# Patient Record
Sex: Male | Born: 1945 | Race: White | Hispanic: No | Marital: Married | Smoking: Current every day smoker
Health system: Southern US, Community
[De-identification: ages and names within clinical notes are randomized; demographics above are authoritative.]

## PROBLEM LIST (undated history)

## (undated) DIAGNOSIS — I10 Essential (primary) hypertension: Secondary | ICD-10-CM

## (undated) DIAGNOSIS — K219 Gastro-esophageal reflux disease without esophagitis: Secondary | ICD-10-CM

## (undated) DIAGNOSIS — E785 Hyperlipidemia, unspecified: Secondary | ICD-10-CM

## (undated) DIAGNOSIS — Z720 Tobacco use: Secondary | ICD-10-CM

## (undated) DIAGNOSIS — R7301 Impaired fasting glucose: Secondary | ICD-10-CM

## (undated) DIAGNOSIS — N423 Unspecified dysplasia of prostate: Secondary | ICD-10-CM

## (undated) DIAGNOSIS — R972 Elevated prostate specific antigen [PSA]: Secondary | ICD-10-CM

## (undated) HISTORY — DX: Impaired fasting glucose: R73.01

## (undated) HISTORY — DX: Tobacco use: Z72.0

## (undated) HISTORY — PX: GUM SURGERY: SHX658

## (undated) HISTORY — DX: Elevated prostate specific antigen (PSA): R97.20

---

## 1999-02-17 ENCOUNTER — Other Ambulatory Visit: Admission: RE | Admit: 1999-02-17 | Discharge: 1999-02-17 | Payer: Self-pay | Admitting: Urology

## 2000-01-27 ENCOUNTER — Ambulatory Visit (HOSPITAL_COMMUNITY): Admission: RE | Admit: 2000-01-27 | Discharge: 2000-01-27 | Payer: Self-pay | Admitting: Gastroenterology

## 2002-02-19 ENCOUNTER — Other Ambulatory Visit: Admission: RE | Admit: 2002-02-19 | Discharge: 2002-02-19 | Payer: Self-pay | Admitting: Otolaryngology

## 2002-03-05 ENCOUNTER — Encounter: Admission: RE | Admit: 2002-03-05 | Discharge: 2002-03-05 | Payer: Self-pay | Admitting: Otolaryngology

## 2002-03-05 ENCOUNTER — Encounter: Payer: Self-pay | Admitting: Otolaryngology

## 2002-04-04 ENCOUNTER — Ambulatory Visit (HOSPITAL_BASED_OUTPATIENT_CLINIC_OR_DEPARTMENT_OTHER): Admission: RE | Admit: 2002-04-04 | Discharge: 2002-04-04 | Payer: Self-pay | Admitting: Otolaryngology

## 2010-01-30 ENCOUNTER — Encounter: Payer: Self-pay | Admitting: Urology

## 2010-05-06 ENCOUNTER — Other Ambulatory Visit: Payer: Self-pay | Admitting: Gastroenterology

## 2011-03-06 DIAGNOSIS — F17200 Nicotine dependence, unspecified, uncomplicated: Secondary | ICD-10-CM | POA: Diagnosis not present

## 2011-03-06 DIAGNOSIS — I10 Essential (primary) hypertension: Secondary | ICD-10-CM | POA: Diagnosis not present

## 2011-03-06 DIAGNOSIS — E782 Mixed hyperlipidemia: Secondary | ICD-10-CM | POA: Diagnosis not present

## 2011-03-06 DIAGNOSIS — Z23 Encounter for immunization: Secondary | ICD-10-CM | POA: Diagnosis not present

## 2011-03-06 DIAGNOSIS — R42 Dizziness and giddiness: Secondary | ICD-10-CM | POA: Diagnosis not present

## 2011-03-06 DIAGNOSIS — G479 Sleep disorder, unspecified: Secondary | ICD-10-CM | POA: Diagnosis not present

## 2011-03-06 DIAGNOSIS — K219 Gastro-esophageal reflux disease without esophagitis: Secondary | ICD-10-CM | POA: Diagnosis not present

## 2011-03-06 DIAGNOSIS — E119 Type 2 diabetes mellitus without complications: Secondary | ICD-10-CM | POA: Diagnosis not present

## 2011-03-09 DIAGNOSIS — E782 Mixed hyperlipidemia: Secondary | ICD-10-CM | POA: Diagnosis not present

## 2011-03-09 DIAGNOSIS — E119 Type 2 diabetes mellitus without complications: Secondary | ICD-10-CM | POA: Diagnosis not present

## 2011-03-09 DIAGNOSIS — B353 Tinea pedis: Secondary | ICD-10-CM | POA: Diagnosis not present

## 2011-03-09 DIAGNOSIS — K219 Gastro-esophageal reflux disease without esophagitis: Secondary | ICD-10-CM | POA: Diagnosis not present

## 2011-03-09 DIAGNOSIS — R972 Elevated prostate specific antigen [PSA]: Secondary | ICD-10-CM | POA: Diagnosis not present

## 2011-03-09 DIAGNOSIS — F17200 Nicotine dependence, unspecified, uncomplicated: Secondary | ICD-10-CM | POA: Diagnosis not present

## 2011-03-09 DIAGNOSIS — G479 Sleep disorder, unspecified: Secondary | ICD-10-CM | POA: Diagnosis not present

## 2011-03-09 DIAGNOSIS — I10 Essential (primary) hypertension: Secondary | ICD-10-CM | POA: Diagnosis not present

## 2011-07-12 ENCOUNTER — Ambulatory Visit (HOSPITAL_COMMUNITY)
Admission: RE | Admit: 2011-07-12 | Discharge: 2011-07-12 | Disposition: A | Discharge status: Home / Self Care | Payer: Medicare Other | Source: Ambulatory Visit | Attending: Gastroenterology | Admitting: Gastroenterology

## 2011-07-12 ENCOUNTER — Encounter (HOSPITAL_COMMUNITY): Payer: Self-pay | Admitting: *Deleted

## 2011-07-12 ENCOUNTER — Encounter (HOSPITAL_COMMUNITY)
Admission: RE | Disposition: A | Discharge status: Home / Self Care | Payer: Self-pay | Source: Ambulatory Visit | Attending: Gastroenterology

## 2011-07-12 DIAGNOSIS — D126 Benign neoplasm of colon, unspecified: Secondary | ICD-10-CM | POA: Diagnosis not present

## 2011-07-12 DIAGNOSIS — K644 Residual hemorrhoidal skin tags: Secondary | ICD-10-CM | POA: Insufficient documentation

## 2011-07-12 DIAGNOSIS — K648 Other hemorrhoids: Secondary | ICD-10-CM | POA: Insufficient documentation

## 2011-07-12 HISTORY — DX: Unspecified dysplasia of prostate: N42.30

## 2011-07-12 HISTORY — DX: Essential (primary) hypertension: I10

## 2011-07-12 HISTORY — PX: COLONOSCOPY: SHX5424

## 2011-07-12 HISTORY — DX: Hyperlipidemia, unspecified: E78.5

## 2011-07-12 HISTORY — DX: Gastro-esophageal reflux disease without esophagitis: K21.9

## 2011-07-12 LAB — GLUCOSE, CAPILLARY: Glucose-Capillary: 89 mg/dL (ref 70–99)

## 2011-07-12 SURGERY — COLONOSCOPY
Anesthesia: Moderate Sedation

## 2011-07-12 SURGERY — Surgical Case
Anesthesia: *Unknown

## 2011-07-12 MED ORDER — MIDAZOLAM HCL 5 MG/5ML IJ SOLN
INTRAMUSCULAR | Status: DC | PRN
  Administered 2011-07-12: 1 mg via INTRAVENOUS
  Administered 2011-07-12 (×2): 2 mg via INTRAVENOUS
  Administered 2011-07-12: 1 mg via INTRAVENOUS

## 2011-07-12 MED ORDER — SODIUM CHLORIDE 0.9 % IV SOLN
Freq: Once | INTRAVENOUS | Status: AC
  Administered 2011-07-12: 500 mL via INTRAVENOUS

## 2011-07-12 MED ORDER — MIDAZOLAM HCL 10 MG/2ML IJ SOLN
INTRAMUSCULAR | Status: AC
  Filled 2011-07-12: qty 4

## 2011-07-12 MED ORDER — FENTANYL CITRATE 0.05 MG/ML IJ SOLN
INTRAMUSCULAR | Status: DC | PRN
  Administered 2011-07-12 (×3): 25 ug via INTRAVENOUS

## 2011-07-12 MED ORDER — FENTANYL CITRATE 0.05 MG/ML IJ SOLN
INTRAMUSCULAR | Status: AC
  Filled 2011-07-12: qty 4

## 2011-07-12 NOTE — Progress Notes (Signed)
Mark Espinoza 8:43 AM  Subjective: Patient with no new medical complaints or problems since we've seen him and his history was Reviewed And rediscussed Objective: Vital signs stable afebrile no acute distress exam please see preop assessment physical  Assessment: Difficult to remove colon polyp  Plan: Okay for sedation and colonoscopy  [facility]

## 2011-07-12 NOTE — Op Note (Signed)
[facility] [address] [place], [place]  [address]  COLONOSCOPY PROCEDURE REPORT  PATIENT:  Mark, Espinoza  MR#:  [id number] BIRTHDATE:  1945/06/18, 65 yrs. old  GENDER:  male ENDOSCOPIST:  Vida Rigger, MD REF. BY: PROCEDURE DATE:  07/12/2011 PROCEDURE:  Colonoscopy with hot biopsy and snare polypectomy, Colonoscopy with ablation ASA CLASS:  Class II INDICATIONS:  difficult to remove polyp MEDICATIONS:  75 micrograms fentanyl 6 mg Versed DESCRIPTION OF PROCEDURE:   After the risks benefits and alternatives of the procedure were thoroughly explained, informed consent was obtained.  The Pentax Peds Colonoscope [id number] endoscope was introduced through the anus and advanced to the cecum, which was identified by both the appendix and ileocecal valve, without limitations. This did require abdominal pressure but no position change in The quality of the prep was adequate.. The instrument was then slowly withdrawn as the colon was fully examined. The findings are recorded below the residual polyp was found in the mid transverse and unfortunately despite a prolonged effort we could not snare the residual polyp some multiple hot biopsies were done and then we used the Erbe APC until no obvious residual polypoid tissue was seen. We then slowly withdrew and no additional findings were seen and once back in the rectum anal rectal pull-through and retroflexion confirms some small hemorrhoids. The scope was reinserted a short ways up the colon air was suctioned scope removed patient tolerated the procedure well there was no obvious immediate complication <<PROCEDUREIMAGES>>  FINDINGS: 1. Small internal/external hemorrhoids 2. Sessile residual mid transverse polyp and [place] ink tattoo status post multiple hot biopsies and APC 3. Otherwise within normal limits to the cecum with the cecal picture accidentally being deleted COMPLICATIONS:  None  IMPRESSION: Above  RECOMMENDATIONS:  Await pathology to determine future colonic screening probably look in another year or 2 to be sure and happy to see back sooner when necessary  ______________________________ Vida Rigger, MD  CC:  n. eSIGNEDVida Rigger at 07/12/2011 09:59 AM  Christeen Douglas, [phone]

## 2011-07-15 ENCOUNTER — Ambulatory Visit (HOSPITAL_COMMUNITY): Admission: RE | Admit: 2011-07-15 | Payer: Self-pay | Source: Ambulatory Visit | Admitting: Gastroenterology

## 2011-07-15 ENCOUNTER — Encounter (HOSPITAL_COMMUNITY): Admission: RE | Payer: Self-pay | Source: Ambulatory Visit

## 2011-07-15 SURGERY — COLONOSCOPY
Anesthesia: Moderate Sedation

## 2011-08-18 DIAGNOSIS — H25049 Posterior subcapsular polar age-related cataract, unspecified eye: Secondary | ICD-10-CM | POA: Diagnosis not present

## 2011-08-18 DIAGNOSIS — H2510 Age-related nuclear cataract, unspecified eye: Secondary | ICD-10-CM | POA: Diagnosis not present

## 2011-08-18 DIAGNOSIS — H43819 Vitreous degeneration, unspecified eye: Secondary | ICD-10-CM | POA: Diagnosis not present

## 2011-08-18 DIAGNOSIS — E119 Type 2 diabetes mellitus without complications: Secondary | ICD-10-CM | POA: Diagnosis not present

## 2011-08-28 DIAGNOSIS — K219 Gastro-esophageal reflux disease without esophagitis: Secondary | ICD-10-CM | POA: Diagnosis not present

## 2011-08-28 DIAGNOSIS — E119 Type 2 diabetes mellitus without complications: Secondary | ICD-10-CM | POA: Diagnosis not present

## 2011-08-28 DIAGNOSIS — I10 Essential (primary) hypertension: Secondary | ICD-10-CM | POA: Diagnosis not present

## 2011-08-28 DIAGNOSIS — E782 Mixed hyperlipidemia: Secondary | ICD-10-CM | POA: Diagnosis not present

## 2011-08-28 DIAGNOSIS — F17200 Nicotine dependence, unspecified, uncomplicated: Secondary | ICD-10-CM | POA: Diagnosis not present

## 2011-08-28 DIAGNOSIS — R42 Dizziness and giddiness: Secondary | ICD-10-CM | POA: Diagnosis not present

## 2011-08-30 DIAGNOSIS — Z0000 Encounter for general adult medical examination without abnormal findings: Secondary | ICD-10-CM | POA: Diagnosis not present

## 2011-08-30 DIAGNOSIS — D4959 Neoplasm of unspecified behavior of other genitourinary organ: Secondary | ICD-10-CM | POA: Diagnosis not present

## 2011-09-04 DIAGNOSIS — R809 Proteinuria, unspecified: Secondary | ICD-10-CM | POA: Diagnosis not present

## 2011-09-04 DIAGNOSIS — E782 Mixed hyperlipidemia: Secondary | ICD-10-CM | POA: Diagnosis not present

## 2011-09-04 DIAGNOSIS — G479 Sleep disorder, unspecified: Secondary | ICD-10-CM | POA: Diagnosis not present

## 2011-09-04 DIAGNOSIS — K219 Gastro-esophageal reflux disease without esophagitis: Secondary | ICD-10-CM | POA: Diagnosis not present

## 2011-09-04 DIAGNOSIS — F17200 Nicotine dependence, unspecified, uncomplicated: Secondary | ICD-10-CM | POA: Diagnosis not present

## 2011-09-04 DIAGNOSIS — E119 Type 2 diabetes mellitus without complications: Secondary | ICD-10-CM | POA: Diagnosis not present

## 2011-09-04 DIAGNOSIS — D126 Benign neoplasm of colon, unspecified: Secondary | ICD-10-CM | POA: Diagnosis not present

## 2011-09-04 DIAGNOSIS — I10 Essential (primary) hypertension: Secondary | ICD-10-CM | POA: Diagnosis not present

## 2011-09-06 DIAGNOSIS — R3129 Other microscopic hematuria: Secondary | ICD-10-CM | POA: Diagnosis not present

## 2011-09-06 DIAGNOSIS — N401 Enlarged prostate with lower urinary tract symptoms: Secondary | ICD-10-CM | POA: Diagnosis not present

## 2011-09-06 DIAGNOSIS — N529 Male erectile dysfunction, unspecified: Secondary | ICD-10-CM | POA: Diagnosis not present

## 2011-10-16 DIAGNOSIS — H538 Other visual disturbances: Secondary | ICD-10-CM | POA: Diagnosis not present

## 2011-11-06 DIAGNOSIS — N200 Calculus of kidney: Secondary | ICD-10-CM | POA: Diagnosis not present

## 2011-11-06 DIAGNOSIS — K861 Other chronic pancreatitis: Secondary | ICD-10-CM | POA: Diagnosis not present

## 2011-11-06 DIAGNOSIS — R3129 Other microscopic hematuria: Secondary | ICD-10-CM | POA: Diagnosis not present

## 2011-11-06 DIAGNOSIS — K8020 Calculus of gallbladder without cholecystitis without obstruction: Secondary | ICD-10-CM | POA: Diagnosis not present

## 2011-11-15 DIAGNOSIS — B356 Tinea cruris: Secondary | ICD-10-CM | POA: Diagnosis not present

## 2011-11-15 DIAGNOSIS — R3129 Other microscopic hematuria: Secondary | ICD-10-CM | POA: Diagnosis not present

## 2011-12-14 DIAGNOSIS — Z23 Encounter for immunization: Secondary | ICD-10-CM | POA: Diagnosis not present

## 2011-12-26 DIAGNOSIS — K219 Gastro-esophageal reflux disease without esophagitis: Secondary | ICD-10-CM | POA: Diagnosis not present

## 2011-12-26 DIAGNOSIS — R932 Abnormal findings on diagnostic imaging of liver and biliary tract: Secondary | ICD-10-CM | POA: Diagnosis not present

## 2012-02-16 DIAGNOSIS — L290 Pruritus ani: Secondary | ICD-10-CM | POA: Diagnosis not present

## 2012-02-19 DIAGNOSIS — F17200 Nicotine dependence, unspecified, uncomplicated: Secondary | ICD-10-CM | POA: Diagnosis not present

## 2012-02-19 DIAGNOSIS — Z0389 Encounter for observation for other suspected diseases and conditions ruled out: Secondary | ICD-10-CM | POA: Diagnosis not present

## 2012-02-19 DIAGNOSIS — I10 Essential (primary) hypertension: Secondary | ICD-10-CM | POA: Diagnosis not present

## 2012-02-19 DIAGNOSIS — E782 Mixed hyperlipidemia: Secondary | ICD-10-CM | POA: Diagnosis not present

## 2012-02-19 DIAGNOSIS — I2510 Atherosclerotic heart disease of native coronary artery without angina pectoris: Secondary | ICD-10-CM | POA: Diagnosis not present

## 2012-02-29 DIAGNOSIS — L2089 Other atopic dermatitis: Secondary | ICD-10-CM | POA: Diagnosis not present

## 2012-02-29 DIAGNOSIS — L259 Unspecified contact dermatitis, unspecified cause: Secondary | ICD-10-CM | POA: Diagnosis not present

## 2012-03-04 DIAGNOSIS — I10 Essential (primary) hypertension: Secondary | ICD-10-CM | POA: Diagnosis not present

## 2012-03-04 DIAGNOSIS — F17200 Nicotine dependence, unspecified, uncomplicated: Secondary | ICD-10-CM | POA: Diagnosis not present

## 2012-03-04 DIAGNOSIS — G479 Sleep disorder, unspecified: Secondary | ICD-10-CM | POA: Diagnosis not present

## 2012-03-04 DIAGNOSIS — E782 Mixed hyperlipidemia: Secondary | ICD-10-CM | POA: Diagnosis not present

## 2012-03-04 DIAGNOSIS — K219 Gastro-esophageal reflux disease without esophagitis: Secondary | ICD-10-CM | POA: Diagnosis not present

## 2012-03-04 DIAGNOSIS — R809 Proteinuria, unspecified: Secondary | ICD-10-CM | POA: Diagnosis not present

## 2012-03-04 DIAGNOSIS — D126 Benign neoplasm of colon, unspecified: Secondary | ICD-10-CM | POA: Diagnosis not present

## 2012-03-04 DIAGNOSIS — E119 Type 2 diabetes mellitus without complications: Secondary | ICD-10-CM | POA: Diagnosis not present

## 2012-03-11 DIAGNOSIS — R809 Proteinuria, unspecified: Secondary | ICD-10-CM | POA: Diagnosis not present

## 2012-03-11 DIAGNOSIS — I10 Essential (primary) hypertension: Secondary | ICD-10-CM | POA: Diagnosis not present

## 2012-03-11 DIAGNOSIS — G479 Sleep disorder, unspecified: Secondary | ICD-10-CM | POA: Diagnosis not present

## 2012-03-11 DIAGNOSIS — E782 Mixed hyperlipidemia: Secondary | ICD-10-CM | POA: Diagnosis not present

## 2012-03-11 DIAGNOSIS — E1129 Type 2 diabetes mellitus with other diabetic kidney complication: Secondary | ICD-10-CM | POA: Diagnosis not present

## 2012-03-11 DIAGNOSIS — N183 Chronic kidney disease, stage 3 unspecified: Secondary | ICD-10-CM | POA: Diagnosis not present

## 2012-03-11 DIAGNOSIS — Z1331 Encounter for screening for depression: Secondary | ICD-10-CM | POA: Diagnosis not present

## 2012-03-11 DIAGNOSIS — L2089 Other atopic dermatitis: Secondary | ICD-10-CM | POA: Diagnosis not present

## 2012-03-11 DIAGNOSIS — R972 Elevated prostate specific antigen [PSA]: Secondary | ICD-10-CM | POA: Diagnosis not present

## 2012-09-17 DIAGNOSIS — K219 Gastro-esophageal reflux disease without esophagitis: Secondary | ICD-10-CM | POA: Diagnosis not present

## 2012-09-17 DIAGNOSIS — G479 Sleep disorder, unspecified: Secondary | ICD-10-CM | POA: Diagnosis not present

## 2012-09-17 DIAGNOSIS — E119 Type 2 diabetes mellitus without complications: Secondary | ICD-10-CM | POA: Diagnosis not present

## 2012-09-17 DIAGNOSIS — R972 Elevated prostate specific antigen [PSA]: Secondary | ICD-10-CM | POA: Diagnosis not present

## 2012-09-17 DIAGNOSIS — E782 Mixed hyperlipidemia: Secondary | ICD-10-CM | POA: Diagnosis not present

## 2012-09-17 DIAGNOSIS — E1129 Type 2 diabetes mellitus with other diabetic kidney complication: Secondary | ICD-10-CM | POA: Diagnosis not present

## 2012-09-17 DIAGNOSIS — I10 Essential (primary) hypertension: Secondary | ICD-10-CM | POA: Diagnosis not present

## 2012-09-17 DIAGNOSIS — N183 Chronic kidney disease, stage 3 unspecified: Secondary | ICD-10-CM | POA: Diagnosis not present

## 2012-09-23 DIAGNOSIS — I2510 Atherosclerotic heart disease of native coronary artery without angina pectoris: Secondary | ICD-10-CM | POA: Diagnosis not present

## 2012-09-23 DIAGNOSIS — E1129 Type 2 diabetes mellitus with other diabetic kidney complication: Secondary | ICD-10-CM | POA: Diagnosis not present

## 2012-09-23 DIAGNOSIS — R972 Elevated prostate specific antigen [PSA]: Secondary | ICD-10-CM | POA: Diagnosis not present

## 2012-09-23 DIAGNOSIS — Z23 Encounter for immunization: Secondary | ICD-10-CM | POA: Diagnosis not present

## 2012-09-23 DIAGNOSIS — N400 Enlarged prostate without lower urinary tract symptoms: Secondary | ICD-10-CM | POA: Diagnosis not present

## 2012-09-23 DIAGNOSIS — E782 Mixed hyperlipidemia: Secondary | ICD-10-CM | POA: Diagnosis not present

## 2012-09-23 DIAGNOSIS — I10 Essential (primary) hypertension: Secondary | ICD-10-CM | POA: Diagnosis not present

## 2012-09-23 DIAGNOSIS — R809 Proteinuria, unspecified: Secondary | ICD-10-CM | POA: Diagnosis not present

## 2012-09-23 DIAGNOSIS — B353 Tinea pedis: Secondary | ICD-10-CM | POA: Diagnosis not present

## 2012-09-27 DIAGNOSIS — E119 Type 2 diabetes mellitus without complications: Secondary | ICD-10-CM | POA: Diagnosis not present

## 2012-09-27 DIAGNOSIS — H52229 Regular astigmatism, unspecified eye: Secondary | ICD-10-CM | POA: Diagnosis not present

## 2012-09-27 DIAGNOSIS — H538 Other visual disturbances: Secondary | ICD-10-CM | POA: Diagnosis not present

## 2012-09-27 DIAGNOSIS — H2510 Age-related nuclear cataract, unspecified eye: Secondary | ICD-10-CM | POA: Diagnosis not present

## 2012-09-27 DIAGNOSIS — H25049 Posterior subcapsular polar age-related cataract, unspecified eye: Secondary | ICD-10-CM | POA: Diagnosis not present

## 2012-10-04 DIAGNOSIS — F17200 Nicotine dependence, unspecified, uncomplicated: Secondary | ICD-10-CM | POA: Diagnosis not present

## 2012-10-04 DIAGNOSIS — I2510 Atherosclerotic heart disease of native coronary artery without angina pectoris: Secondary | ICD-10-CM | POA: Diagnosis not present

## 2012-10-04 DIAGNOSIS — Z0389 Encounter for observation for other suspected diseases and conditions ruled out: Secondary | ICD-10-CM | POA: Diagnosis not present

## 2012-10-04 DIAGNOSIS — I10 Essential (primary) hypertension: Secondary | ICD-10-CM | POA: Diagnosis not present

## 2012-10-04 DIAGNOSIS — E782 Mixed hyperlipidemia: Secondary | ICD-10-CM | POA: Diagnosis not present

## 2012-11-13 DIAGNOSIS — N400 Enlarged prostate without lower urinary tract symptoms: Secondary | ICD-10-CM | POA: Diagnosis not present

## 2012-11-20 DIAGNOSIS — N529 Male erectile dysfunction, unspecified: Secondary | ICD-10-CM | POA: Diagnosis not present

## 2012-11-20 DIAGNOSIS — N401 Enlarged prostate with lower urinary tract symptoms: Secondary | ICD-10-CM | POA: Diagnosis not present

## 2012-11-20 DIAGNOSIS — N139 Obstructive and reflux uropathy, unspecified: Secondary | ICD-10-CM | POA: Diagnosis not present

## 2012-12-03 DIAGNOSIS — B029 Zoster without complications: Secondary | ICD-10-CM | POA: Diagnosis not present

## 2013-01-08 ENCOUNTER — Encounter: Payer: Self-pay | Admitting: Cardiology

## 2013-01-10 ENCOUNTER — Encounter: Payer: Self-pay | Admitting: Cardiology

## 2013-02-17 ENCOUNTER — Ambulatory Visit: Payer: 59 | Admitting: Cardiology

## 2013-03-17 ENCOUNTER — Encounter: Payer: Self-pay | Admitting: General Surgery

## 2013-03-17 DIAGNOSIS — F17200 Nicotine dependence, unspecified, uncomplicated: Secondary | ICD-10-CM

## 2013-03-17 DIAGNOSIS — I10 Essential (primary) hypertension: Secondary | ICD-10-CM

## 2013-03-17 DIAGNOSIS — Z0389 Encounter for observation for other suspected diseases and conditions ruled out: Secondary | ICD-10-CM | POA: Insufficient documentation

## 2013-03-17 DIAGNOSIS — I2510 Atherosclerotic heart disease of native coronary artery without angina pectoris: Secondary | ICD-10-CM | POA: Insufficient documentation

## 2013-03-17 DIAGNOSIS — E782 Mixed hyperlipidemia: Secondary | ICD-10-CM

## 2013-03-21 DIAGNOSIS — E782 Mixed hyperlipidemia: Secondary | ICD-10-CM | POA: Diagnosis not present

## 2013-03-21 DIAGNOSIS — I10 Essential (primary) hypertension: Secondary | ICD-10-CM | POA: Diagnosis not present

## 2013-03-21 DIAGNOSIS — N183 Chronic kidney disease, stage 3 unspecified: Secondary | ICD-10-CM | POA: Diagnosis not present

## 2013-03-21 DIAGNOSIS — K219 Gastro-esophageal reflux disease without esophagitis: Secondary | ICD-10-CM | POA: Diagnosis not present

## 2013-03-21 DIAGNOSIS — E1129 Type 2 diabetes mellitus with other diabetic kidney complication: Secondary | ICD-10-CM | POA: Diagnosis not present

## 2013-03-21 DIAGNOSIS — R972 Elevated prostate specific antigen [PSA]: Secondary | ICD-10-CM | POA: Diagnosis not present

## 2013-03-21 DIAGNOSIS — F17200 Nicotine dependence, unspecified, uncomplicated: Secondary | ICD-10-CM | POA: Diagnosis not present

## 2013-03-21 DIAGNOSIS — E119 Type 2 diabetes mellitus without complications: Secondary | ICD-10-CM | POA: Diagnosis not present

## 2013-03-24 ENCOUNTER — Ambulatory Visit: Payer: 59 | Admitting: Cardiology

## 2013-03-24 DIAGNOSIS — I10 Essential (primary) hypertension: Secondary | ICD-10-CM | POA: Diagnosis not present

## 2013-03-24 DIAGNOSIS — F17200 Nicotine dependence, unspecified, uncomplicated: Secondary | ICD-10-CM | POA: Diagnosis not present

## 2013-03-24 DIAGNOSIS — R809 Proteinuria, unspecified: Secondary | ICD-10-CM | POA: Diagnosis not present

## 2013-03-24 DIAGNOSIS — Z1331 Encounter for screening for depression: Secondary | ICD-10-CM | POA: Diagnosis not present

## 2013-03-24 DIAGNOSIS — Z23 Encounter for immunization: Secondary | ICD-10-CM | POA: Diagnosis not present

## 2013-03-24 DIAGNOSIS — E782 Mixed hyperlipidemia: Secondary | ICD-10-CM | POA: Diagnosis not present

## 2013-03-24 DIAGNOSIS — E119 Type 2 diabetes mellitus without complications: Secondary | ICD-10-CM | POA: Diagnosis not present

## 2013-03-24 DIAGNOSIS — K219 Gastro-esophageal reflux disease without esophagitis: Secondary | ICD-10-CM | POA: Diagnosis not present

## 2013-03-24 DIAGNOSIS — Z0000 Encounter for general adult medical examination without abnormal findings: Secondary | ICD-10-CM | POA: Diagnosis not present

## 2013-04-28 ENCOUNTER — Ambulatory Visit: Payer: 59 | Admitting: Cardiology

## 2013-04-29 ENCOUNTER — Ambulatory Visit: Payer: 59 | Admitting: Cardiology

## 2013-09-26 DIAGNOSIS — H43819 Vitreous degeneration, unspecified eye: Secondary | ICD-10-CM | POA: Diagnosis not present

## 2013-09-26 DIAGNOSIS — H52229 Regular astigmatism, unspecified eye: Secondary | ICD-10-CM | POA: Diagnosis not present

## 2013-09-26 DIAGNOSIS — H2510 Age-related nuclear cataract, unspecified eye: Secondary | ICD-10-CM | POA: Diagnosis not present

## 2013-09-26 DIAGNOSIS — H538 Other visual disturbances: Secondary | ICD-10-CM | POA: Diagnosis not present

## 2013-10-08 DIAGNOSIS — E782 Mixed hyperlipidemia: Secondary | ICD-10-CM | POA: Diagnosis not present

## 2013-10-08 DIAGNOSIS — F17200 Nicotine dependence, unspecified, uncomplicated: Secondary | ICD-10-CM | POA: Diagnosis not present

## 2013-10-08 DIAGNOSIS — Z0000 Encounter for general adult medical examination without abnormal findings: Secondary | ICD-10-CM | POA: Diagnosis not present

## 2013-10-08 DIAGNOSIS — Z23 Encounter for immunization: Secondary | ICD-10-CM | POA: Diagnosis not present

## 2013-10-08 DIAGNOSIS — R809 Proteinuria, unspecified: Secondary | ICD-10-CM | POA: Diagnosis not present

## 2013-10-08 DIAGNOSIS — E119 Type 2 diabetes mellitus without complications: Secondary | ICD-10-CM | POA: Diagnosis not present

## 2013-10-08 DIAGNOSIS — I10 Essential (primary) hypertension: Secondary | ICD-10-CM | POA: Diagnosis not present

## 2013-10-09 DIAGNOSIS — E1122 Type 2 diabetes mellitus with diabetic chronic kidney disease: Secondary | ICD-10-CM | POA: Diagnosis not present

## 2013-10-13 DIAGNOSIS — E1122 Type 2 diabetes mellitus with diabetic chronic kidney disease: Secondary | ICD-10-CM | POA: Diagnosis not present

## 2013-10-13 DIAGNOSIS — G479 Sleep disorder, unspecified: Secondary | ICD-10-CM | POA: Diagnosis not present

## 2013-10-13 DIAGNOSIS — I2510 Atherosclerotic heart disease of native coronary artery without angina pectoris: Secondary | ICD-10-CM | POA: Diagnosis not present

## 2013-10-13 DIAGNOSIS — Z23 Encounter for immunization: Secondary | ICD-10-CM | POA: Diagnosis not present

## 2013-10-13 DIAGNOSIS — F17210 Nicotine dependence, cigarettes, uncomplicated: Secondary | ICD-10-CM | POA: Diagnosis not present

## 2013-10-13 DIAGNOSIS — K219 Gastro-esophageal reflux disease without esophagitis: Secondary | ICD-10-CM | POA: Diagnosis not present

## 2013-10-13 DIAGNOSIS — E782 Mixed hyperlipidemia: Secondary | ICD-10-CM | POA: Diagnosis not present

## 2013-10-13 DIAGNOSIS — I10 Essential (primary) hypertension: Secondary | ICD-10-CM | POA: Diagnosis not present

## 2013-10-13 DIAGNOSIS — N183 Chronic kidney disease, stage 3 (moderate): Secondary | ICD-10-CM | POA: Diagnosis not present

## 2013-10-17 ENCOUNTER — Encounter: Payer: Self-pay | Admitting: *Deleted

## 2013-11-20 DIAGNOSIS — R3912 Poor urinary stream: Secondary | ICD-10-CM | POA: Diagnosis not present

## 2013-11-20 DIAGNOSIS — E291 Testicular hypofunction: Secondary | ICD-10-CM | POA: Diagnosis not present

## 2013-11-20 DIAGNOSIS — N401 Enlarged prostate with lower urinary tract symptoms: Secondary | ICD-10-CM | POA: Diagnosis not present

## 2013-11-20 DIAGNOSIS — N5201 Erectile dysfunction due to arterial insufficiency: Secondary | ICD-10-CM | POA: Diagnosis not present

## 2013-11-20 DIAGNOSIS — R3915 Urgency of urination: Secondary | ICD-10-CM | POA: Diagnosis not present

## 2014-03-30 DIAGNOSIS — Z86010 Personal history of colonic polyps: Secondary | ICD-10-CM | POA: Diagnosis not present

## 2014-03-30 DIAGNOSIS — F17210 Nicotine dependence, cigarettes, uncomplicated: Secondary | ICD-10-CM | POA: Diagnosis not present

## 2014-03-30 DIAGNOSIS — E1122 Type 2 diabetes mellitus with diabetic chronic kidney disease: Secondary | ICD-10-CM | POA: Diagnosis not present

## 2014-03-30 DIAGNOSIS — K5900 Constipation, unspecified: Secondary | ICD-10-CM | POA: Diagnosis not present

## 2014-03-30 DIAGNOSIS — E782 Mixed hyperlipidemia: Secondary | ICD-10-CM | POA: Diagnosis not present

## 2014-03-30 DIAGNOSIS — G479 Sleep disorder, unspecified: Secondary | ICD-10-CM | POA: Diagnosis not present

## 2014-03-30 DIAGNOSIS — Z23 Encounter for immunization: Secondary | ICD-10-CM | POA: Diagnosis not present

## 2014-03-30 DIAGNOSIS — K219 Gastro-esophageal reflux disease without esophagitis: Secondary | ICD-10-CM | POA: Diagnosis not present

## 2014-03-30 DIAGNOSIS — N183 Chronic kidney disease, stage 3 (moderate): Secondary | ICD-10-CM | POA: Diagnosis not present

## 2014-03-30 DIAGNOSIS — I10 Essential (primary) hypertension: Secondary | ICD-10-CM | POA: Diagnosis not present

## 2014-03-30 DIAGNOSIS — Z1389 Encounter for screening for other disorder: Secondary | ICD-10-CM | POA: Diagnosis not present

## 2014-03-30 DIAGNOSIS — Z0000 Encounter for general adult medical examination without abnormal findings: Secondary | ICD-10-CM | POA: Diagnosis not present

## 2014-09-24 DIAGNOSIS — E119 Type 2 diabetes mellitus without complications: Secondary | ICD-10-CM | POA: Diagnosis not present

## 2014-09-24 DIAGNOSIS — H2513 Age-related nuclear cataract, bilateral: Secondary | ICD-10-CM | POA: Diagnosis not present

## 2014-09-24 DIAGNOSIS — H43813 Vitreous degeneration, bilateral: Secondary | ICD-10-CM | POA: Diagnosis not present

## 2014-10-19 DIAGNOSIS — G479 Sleep disorder, unspecified: Secondary | ICD-10-CM | POA: Diagnosis not present

## 2014-10-19 DIAGNOSIS — Z0000 Encounter for general adult medical examination without abnormal findings: Secondary | ICD-10-CM | POA: Diagnosis not present

## 2014-10-19 DIAGNOSIS — Z1389 Encounter for screening for other disorder: Secondary | ICD-10-CM | POA: Diagnosis not present

## 2014-10-19 DIAGNOSIS — N183 Chronic kidney disease, stage 3 (moderate): Secondary | ICD-10-CM | POA: Diagnosis not present

## 2014-10-19 DIAGNOSIS — K219 Gastro-esophageal reflux disease without esophagitis: Secondary | ICD-10-CM | POA: Diagnosis not present

## 2014-10-19 DIAGNOSIS — I10 Essential (primary) hypertension: Secondary | ICD-10-CM | POA: Diagnosis not present

## 2014-10-19 DIAGNOSIS — Z23 Encounter for immunization: Secondary | ICD-10-CM | POA: Diagnosis not present

## 2014-10-19 DIAGNOSIS — Z86010 Personal history of colonic polyps: Secondary | ICD-10-CM | POA: Diagnosis not present

## 2014-10-19 DIAGNOSIS — E782 Mixed hyperlipidemia: Secondary | ICD-10-CM | POA: Diagnosis not present

## 2014-10-19 DIAGNOSIS — E1122 Type 2 diabetes mellitus with diabetic chronic kidney disease: Secondary | ICD-10-CM | POA: Diagnosis not present

## 2014-10-19 DIAGNOSIS — K5900 Constipation, unspecified: Secondary | ICD-10-CM | POA: Diagnosis not present

## 2014-10-19 DIAGNOSIS — F17210 Nicotine dependence, cigarettes, uncomplicated: Secondary | ICD-10-CM | POA: Diagnosis not present

## 2014-10-22 DIAGNOSIS — G479 Sleep disorder, unspecified: Secondary | ICD-10-CM | POA: Diagnosis not present

## 2014-10-22 DIAGNOSIS — E782 Mixed hyperlipidemia: Secondary | ICD-10-CM | POA: Diagnosis not present

## 2014-10-22 DIAGNOSIS — N183 Chronic kidney disease, stage 3 (moderate): Secondary | ICD-10-CM | POA: Diagnosis not present

## 2014-10-22 DIAGNOSIS — K219 Gastro-esophageal reflux disease without esophagitis: Secondary | ICD-10-CM | POA: Diagnosis not present

## 2014-10-22 DIAGNOSIS — I10 Essential (primary) hypertension: Secondary | ICD-10-CM | POA: Diagnosis not present

## 2014-10-22 DIAGNOSIS — L209 Atopic dermatitis, unspecified: Secondary | ICD-10-CM | POA: Diagnosis not present

## 2014-10-22 DIAGNOSIS — F17210 Nicotine dependence, cigarettes, uncomplicated: Secondary | ICD-10-CM | POA: Diagnosis not present

## 2014-10-22 DIAGNOSIS — I2510 Atherosclerotic heart disease of native coronary artery without angina pectoris: Secondary | ICD-10-CM | POA: Diagnosis not present

## 2014-10-22 DIAGNOSIS — N400 Enlarged prostate without lower urinary tract symptoms: Secondary | ICD-10-CM | POA: Diagnosis not present

## 2014-10-22 DIAGNOSIS — Z23 Encounter for immunization: Secondary | ICD-10-CM | POA: Diagnosis not present

## 2014-10-22 DIAGNOSIS — E1122 Type 2 diabetes mellitus with diabetic chronic kidney disease: Secondary | ICD-10-CM | POA: Diagnosis not present

## 2014-10-28 DIAGNOSIS — H2513 Age-related nuclear cataract, bilateral: Secondary | ICD-10-CM | POA: Diagnosis not present

## 2014-10-28 DIAGNOSIS — H52223 Regular astigmatism, bilateral: Secondary | ICD-10-CM | POA: Diagnosis not present

## 2014-11-25 DIAGNOSIS — N401 Enlarged prostate with lower urinary tract symptoms: Secondary | ICD-10-CM | POA: Diagnosis not present

## 2014-11-25 DIAGNOSIS — R3129 Other microscopic hematuria: Secondary | ICD-10-CM | POA: Diagnosis not present

## 2014-11-25 DIAGNOSIS — R3915 Urgency of urination: Secondary | ICD-10-CM | POA: Diagnosis not present

## 2015-02-05 DIAGNOSIS — M25562 Pain in left knee: Secondary | ICD-10-CM | POA: Diagnosis not present

## 2015-02-05 DIAGNOSIS — L989 Disorder of the skin and subcutaneous tissue, unspecified: Secondary | ICD-10-CM | POA: Diagnosis not present

## 2015-02-25 DIAGNOSIS — L720 Epidermal cyst: Secondary | ICD-10-CM | POA: Diagnosis not present

## 2015-02-25 DIAGNOSIS — L2089 Other atopic dermatitis: Secondary | ICD-10-CM | POA: Diagnosis not present

## 2015-02-25 DIAGNOSIS — L218 Other seborrheic dermatitis: Secondary | ICD-10-CM | POA: Diagnosis not present

## 2015-02-25 DIAGNOSIS — D485 Neoplasm of uncertain behavior of skin: Secondary | ICD-10-CM | POA: Diagnosis not present

## 2015-03-25 DIAGNOSIS — F17210 Nicotine dependence, cigarettes, uncomplicated: Secondary | ICD-10-CM | POA: Diagnosis not present

## 2015-03-25 DIAGNOSIS — Z0000 Encounter for general adult medical examination without abnormal findings: Secondary | ICD-10-CM | POA: Diagnosis not present

## 2015-03-25 DIAGNOSIS — I10 Essential (primary) hypertension: Secondary | ICD-10-CM | POA: Diagnosis not present

## 2015-03-25 DIAGNOSIS — N183 Chronic kidney disease, stage 3 (moderate): Secondary | ICD-10-CM | POA: Diagnosis not present

## 2015-03-25 DIAGNOSIS — N400 Enlarged prostate without lower urinary tract symptoms: Secondary | ICD-10-CM | POA: Diagnosis not present

## 2015-03-25 DIAGNOSIS — R69 Illness, unspecified: Secondary | ICD-10-CM | POA: Diagnosis not present

## 2015-03-25 DIAGNOSIS — E1122 Type 2 diabetes mellitus with diabetic chronic kidney disease: Secondary | ICD-10-CM | POA: Diagnosis not present

## 2015-03-25 DIAGNOSIS — E782 Mixed hyperlipidemia: Secondary | ICD-10-CM | POA: Diagnosis not present

## 2015-03-25 DIAGNOSIS — K5900 Constipation, unspecified: Secondary | ICD-10-CM | POA: Diagnosis not present

## 2015-03-25 DIAGNOSIS — Z86010 Personal history of colonic polyps: Secondary | ICD-10-CM | POA: Diagnosis not present

## 2015-03-31 DIAGNOSIS — E782 Mixed hyperlipidemia: Secondary | ICD-10-CM | POA: Diagnosis not present

## 2015-03-31 DIAGNOSIS — I2510 Atherosclerotic heart disease of native coronary artery without angina pectoris: Secondary | ICD-10-CM | POA: Diagnosis not present

## 2015-03-31 DIAGNOSIS — R69 Illness, unspecified: Secondary | ICD-10-CM | POA: Diagnosis not present

## 2015-03-31 DIAGNOSIS — Z1389 Encounter for screening for other disorder: Secondary | ICD-10-CM | POA: Diagnosis not present

## 2015-03-31 DIAGNOSIS — Z0000 Encounter for general adult medical examination without abnormal findings: Secondary | ICD-10-CM | POA: Diagnosis not present

## 2015-03-31 DIAGNOSIS — F17210 Nicotine dependence, cigarettes, uncomplicated: Secondary | ICD-10-CM | POA: Diagnosis not present

## 2015-03-31 DIAGNOSIS — J449 Chronic obstructive pulmonary disease, unspecified: Secondary | ICD-10-CM | POA: Diagnosis not present

## 2015-03-31 DIAGNOSIS — K635 Polyp of colon: Secondary | ICD-10-CM | POA: Diagnosis not present

## 2015-03-31 DIAGNOSIS — Z7189 Other specified counseling: Secondary | ICD-10-CM | POA: Diagnosis not present

## 2015-03-31 DIAGNOSIS — I10 Essential (primary) hypertension: Secondary | ICD-10-CM | POA: Diagnosis not present

## 2015-09-01 ENCOUNTER — Encounter (HOSPITAL_COMMUNITY): Payer: Self-pay | Admitting: *Deleted

## 2015-09-01 ENCOUNTER — Emergency Department (HOSPITAL_COMMUNITY)
Admission: EM | Admit: 2015-09-01 | Discharge: 2015-09-01 | Disposition: A | Discharge status: Home / Self Care | Payer: Medicare HMO | Attending: Dermatology | Admitting: Dermatology

## 2015-09-01 DIAGNOSIS — X58XXXA Exposure to other specified factors, initial encounter: Secondary | ICD-10-CM | POA: Insufficient documentation

## 2015-09-01 DIAGNOSIS — Y929 Unspecified place or not applicable: Secondary | ICD-10-CM | POA: Insufficient documentation

## 2015-09-01 DIAGNOSIS — T189XXA Foreign body of alimentary tract, part unspecified, initial encounter: Secondary | ICD-10-CM | POA: Insufficient documentation

## 2015-09-01 DIAGNOSIS — Y999 Unspecified external cause status: Secondary | ICD-10-CM | POA: Insufficient documentation

## 2015-09-01 DIAGNOSIS — T18108A Unspecified foreign body in esophagus causing other injury, initial encounter: Secondary | ICD-10-CM | POA: Diagnosis not present

## 2015-09-01 DIAGNOSIS — Y939 Activity, unspecified: Secondary | ICD-10-CM | POA: Diagnosis not present

## 2015-09-01 DIAGNOSIS — Z5321 Procedure and treatment not carried out due to patient leaving prior to being seen by health care provider: Secondary | ICD-10-CM | POA: Insufficient documentation

## 2015-09-01 NOTE — ED Triage Notes (Signed)
Pt reports eating fish last night and a bone got stuck in L side of his throat.  Pt is able to swallow without difficulty.

## 2015-09-01 NOTE — ED Notes (Signed)
Called to take to treatment room  No response from lobby 

## 2015-09-02 DIAGNOSIS — S1015XA Superficial foreign body of throat, initial encounter: Secondary | ICD-10-CM | POA: Diagnosis not present

## 2015-09-02 DIAGNOSIS — J343 Hypertrophy of nasal turbinates: Secondary | ICD-10-CM | POA: Diagnosis not present

## 2015-09-07 DIAGNOSIS — J449 Chronic obstructive pulmonary disease, unspecified: Secondary | ICD-10-CM | POA: Diagnosis not present

## 2015-09-07 DIAGNOSIS — N400 Enlarged prostate without lower urinary tract symptoms: Secondary | ICD-10-CM | POA: Diagnosis not present

## 2015-09-07 DIAGNOSIS — I10 Essential (primary) hypertension: Secondary | ICD-10-CM | POA: Diagnosis not present

## 2015-09-07 DIAGNOSIS — N183 Chronic kidney disease, stage 3 (moderate): Secondary | ICD-10-CM | POA: Diagnosis not present

## 2015-09-07 DIAGNOSIS — I2510 Atherosclerotic heart disease of native coronary artery without angina pectoris: Secondary | ICD-10-CM | POA: Diagnosis not present

## 2015-09-07 DIAGNOSIS — K219 Gastro-esophageal reflux disease without esophagitis: Secondary | ICD-10-CM | POA: Diagnosis not present

## 2015-09-07 DIAGNOSIS — E1122 Type 2 diabetes mellitus with diabetic chronic kidney disease: Secondary | ICD-10-CM | POA: Diagnosis not present

## 2015-09-07 DIAGNOSIS — R809 Proteinuria, unspecified: Secondary | ICD-10-CM | POA: Diagnosis not present

## 2015-09-07 DIAGNOSIS — G479 Sleep disorder, unspecified: Secondary | ICD-10-CM | POA: Diagnosis not present

## 2015-09-07 DIAGNOSIS — E782 Mixed hyperlipidemia: Secondary | ICD-10-CM | POA: Diagnosis not present

## 2015-09-10 DIAGNOSIS — H2513 Age-related nuclear cataract, bilateral: Secondary | ICD-10-CM | POA: Diagnosis not present

## 2015-09-10 DIAGNOSIS — E119 Type 2 diabetes mellitus without complications: Secondary | ICD-10-CM | POA: Diagnosis not present

## 2015-09-10 DIAGNOSIS — H43813 Vitreous degeneration, bilateral: Secondary | ICD-10-CM | POA: Diagnosis not present

## 2015-09-10 DIAGNOSIS — H52223 Regular astigmatism, bilateral: Secondary | ICD-10-CM | POA: Diagnosis not present

## 2015-10-05 DIAGNOSIS — H43813 Vitreous degeneration, bilateral: Secondary | ICD-10-CM | POA: Diagnosis not present

## 2015-10-05 DIAGNOSIS — H52223 Regular astigmatism, bilateral: Secondary | ICD-10-CM | POA: Diagnosis not present

## 2015-11-29 DIAGNOSIS — R3915 Urgency of urination: Secondary | ICD-10-CM | POA: Diagnosis not present

## 2015-11-29 DIAGNOSIS — N5201 Erectile dysfunction due to arterial insufficiency: Secondary | ICD-10-CM | POA: Diagnosis not present

## 2015-11-29 DIAGNOSIS — N401 Enlarged prostate with lower urinary tract symptoms: Secondary | ICD-10-CM | POA: Diagnosis not present

## 2016-02-07 DIAGNOSIS — R69 Illness, unspecified: Secondary | ICD-10-CM | POA: Diagnosis not present

## 2016-03-20 DIAGNOSIS — Z23 Encounter for immunization: Secondary | ICD-10-CM | POA: Diagnosis not present

## 2016-03-20 DIAGNOSIS — Z0000 Encounter for general adult medical examination without abnormal findings: Secondary | ICD-10-CM | POA: Diagnosis not present

## 2016-03-20 DIAGNOSIS — G479 Sleep disorder, unspecified: Secondary | ICD-10-CM | POA: Diagnosis not present

## 2016-03-20 DIAGNOSIS — I2510 Atherosclerotic heart disease of native coronary artery without angina pectoris: Secondary | ICD-10-CM | POA: Diagnosis not present

## 2016-03-20 DIAGNOSIS — F17210 Nicotine dependence, cigarettes, uncomplicated: Secondary | ICD-10-CM | POA: Diagnosis not present

## 2016-03-20 DIAGNOSIS — Z1389 Encounter for screening for other disorder: Secondary | ICD-10-CM | POA: Diagnosis not present

## 2016-03-20 DIAGNOSIS — E1122 Type 2 diabetes mellitus with diabetic chronic kidney disease: Secondary | ICD-10-CM | POA: Diagnosis not present

## 2016-03-20 DIAGNOSIS — E782 Mixed hyperlipidemia: Secondary | ICD-10-CM | POA: Diagnosis not present

## 2016-03-20 DIAGNOSIS — Z7189 Other specified counseling: Secondary | ICD-10-CM | POA: Diagnosis not present

## 2016-03-20 DIAGNOSIS — R69 Illness, unspecified: Secondary | ICD-10-CM | POA: Diagnosis not present

## 2016-03-20 DIAGNOSIS — I10 Essential (primary) hypertension: Secondary | ICD-10-CM | POA: Diagnosis not present

## 2016-04-10 DIAGNOSIS — E1122 Type 2 diabetes mellitus with diabetic chronic kidney disease: Secondary | ICD-10-CM | POA: Diagnosis not present

## 2016-04-10 DIAGNOSIS — Z0001 Encounter for general adult medical examination with abnormal findings: Secondary | ICD-10-CM | POA: Diagnosis not present

## 2016-04-10 DIAGNOSIS — K219 Gastro-esophageal reflux disease without esophagitis: Secondary | ICD-10-CM | POA: Diagnosis not present

## 2016-04-10 DIAGNOSIS — R42 Dizziness and giddiness: Secondary | ICD-10-CM | POA: Diagnosis not present

## 2016-04-10 DIAGNOSIS — L723 Sebaceous cyst: Secondary | ICD-10-CM | POA: Diagnosis not present

## 2016-04-10 DIAGNOSIS — Z1389 Encounter for screening for other disorder: Secondary | ICD-10-CM | POA: Diagnosis not present

## 2016-04-10 DIAGNOSIS — I10 Essential (primary) hypertension: Secondary | ICD-10-CM | POA: Diagnosis not present

## 2016-04-10 DIAGNOSIS — I2510 Atherosclerotic heart disease of native coronary artery without angina pectoris: Secondary | ICD-10-CM | POA: Diagnosis not present

## 2016-04-10 DIAGNOSIS — E782 Mixed hyperlipidemia: Secondary | ICD-10-CM | POA: Diagnosis not present

## 2016-04-10 DIAGNOSIS — N183 Chronic kidney disease, stage 3 (moderate): Secondary | ICD-10-CM | POA: Diagnosis not present

## 2016-04-21 DIAGNOSIS — D369 Benign neoplasm, unspecified site: Secondary | ICD-10-CM | POA: Diagnosis not present

## 2016-05-12 ENCOUNTER — Other Ambulatory Visit: Payer: Self-pay | Admitting: General Surgery

## 2016-05-12 DIAGNOSIS — L723 Sebaceous cyst: Secondary | ICD-10-CM | POA: Diagnosis not present

## 2016-05-12 DIAGNOSIS — L720 Epidermal cyst: Secondary | ICD-10-CM | POA: Diagnosis not present

## 2016-07-20 DIAGNOSIS — E119 Type 2 diabetes mellitus without complications: Secondary | ICD-10-CM | POA: Diagnosis not present

## 2016-07-20 DIAGNOSIS — H1030 Unspecified acute conjunctivitis, unspecified eye: Secondary | ICD-10-CM | POA: Diagnosis not present

## 2016-07-20 DIAGNOSIS — H2513 Age-related nuclear cataract, bilateral: Secondary | ICD-10-CM | POA: Diagnosis not present

## 2016-07-28 DIAGNOSIS — H43813 Vitreous degeneration, bilateral: Secondary | ICD-10-CM | POA: Diagnosis not present

## 2016-07-28 DIAGNOSIS — Z0101 Encounter for examination of eyes and vision with abnormal findings: Secondary | ICD-10-CM | POA: Diagnosis not present

## 2016-07-28 DIAGNOSIS — H2513 Age-related nuclear cataract, bilateral: Secondary | ICD-10-CM | POA: Diagnosis not present

## 2016-07-28 DIAGNOSIS — E119 Type 2 diabetes mellitus without complications: Secondary | ICD-10-CM | POA: Diagnosis not present

## 2016-08-07 DIAGNOSIS — J302 Other seasonal allergic rhinitis: Secondary | ICD-10-CM | POA: Diagnosis not present

## 2016-10-10 DIAGNOSIS — R42 Dizziness and giddiness: Secondary | ICD-10-CM | POA: Diagnosis not present

## 2016-10-10 DIAGNOSIS — L723 Sebaceous cyst: Secondary | ICD-10-CM | POA: Diagnosis not present

## 2016-10-10 DIAGNOSIS — I2510 Atherosclerotic heart disease of native coronary artery without angina pectoris: Secondary | ICD-10-CM | POA: Diagnosis not present

## 2016-10-10 DIAGNOSIS — Z7984 Long term (current) use of oral hypoglycemic drugs: Secondary | ICD-10-CM | POA: Diagnosis not present

## 2016-10-10 DIAGNOSIS — N183 Chronic kidney disease, stage 3 (moderate): Secondary | ICD-10-CM | POA: Diagnosis not present

## 2016-10-10 DIAGNOSIS — E782 Mixed hyperlipidemia: Secondary | ICD-10-CM | POA: Diagnosis not present

## 2016-10-10 DIAGNOSIS — K219 Gastro-esophageal reflux disease without esophagitis: Secondary | ICD-10-CM | POA: Diagnosis not present

## 2016-10-10 DIAGNOSIS — E1122 Type 2 diabetes mellitus with diabetic chronic kidney disease: Secondary | ICD-10-CM | POA: Diagnosis not present

## 2016-10-10 DIAGNOSIS — Z0001 Encounter for general adult medical examination with abnormal findings: Secondary | ICD-10-CM | POA: Diagnosis not present

## 2016-10-10 DIAGNOSIS — I10 Essential (primary) hypertension: Secondary | ICD-10-CM | POA: Diagnosis not present

## 2016-10-16 DIAGNOSIS — J449 Chronic obstructive pulmonary disease, unspecified: Secondary | ICD-10-CM | POA: Diagnosis not present

## 2016-10-16 DIAGNOSIS — I2510 Atherosclerotic heart disease of native coronary artery without angina pectoris: Secondary | ICD-10-CM | POA: Diagnosis not present

## 2016-10-16 DIAGNOSIS — E782 Mixed hyperlipidemia: Secondary | ICD-10-CM | POA: Diagnosis not present

## 2016-10-16 DIAGNOSIS — E1122 Type 2 diabetes mellitus with diabetic chronic kidney disease: Secondary | ICD-10-CM | POA: Diagnosis not present

## 2016-10-16 DIAGNOSIS — K219 Gastro-esophageal reflux disease without esophagitis: Secondary | ICD-10-CM | POA: Diagnosis not present

## 2016-10-16 DIAGNOSIS — I10 Essential (primary) hypertension: Secondary | ICD-10-CM | POA: Diagnosis not present

## 2016-10-16 DIAGNOSIS — N400 Enlarged prostate without lower urinary tract symptoms: Secondary | ICD-10-CM | POA: Diagnosis not present

## 2016-10-16 DIAGNOSIS — Z23 Encounter for immunization: Secondary | ICD-10-CM | POA: Diagnosis not present

## 2016-10-16 DIAGNOSIS — N183 Chronic kidney disease, stage 3 (moderate): Secondary | ICD-10-CM | POA: Diagnosis not present

## 2016-10-16 DIAGNOSIS — Z122 Encounter for screening for malignant neoplasm of respiratory organs: Secondary | ICD-10-CM | POA: Diagnosis not present

## 2016-11-27 ENCOUNTER — Other Ambulatory Visit: Payer: Self-pay | Admitting: Acute Care

## 2016-11-27 DIAGNOSIS — Z122 Encounter for screening for malignant neoplasm of respiratory organs: Secondary | ICD-10-CM

## 2016-11-27 DIAGNOSIS — F17210 Nicotine dependence, cigarettes, uncomplicated: Secondary | ICD-10-CM

## 2016-11-29 DIAGNOSIS — N5201 Erectile dysfunction due to arterial insufficiency: Secondary | ICD-10-CM | POA: Diagnosis not present

## 2016-11-29 DIAGNOSIS — R3912 Poor urinary stream: Secondary | ICD-10-CM | POA: Diagnosis not present

## 2016-11-29 DIAGNOSIS — N401 Enlarged prostate with lower urinary tract symptoms: Secondary | ICD-10-CM | POA: Diagnosis not present

## 2016-12-26 ENCOUNTER — Telehealth: Payer: Self-pay | Admitting: Acute Care

## 2016-12-27 ENCOUNTER — Encounter: Payer: Medicare Other | Admitting: Acute Care

## 2016-12-27 ENCOUNTER — Inpatient Hospital Stay: Admission: RE | Admit: 2016-12-27 | Payer: Medicare Other | Source: Ambulatory Visit

## 2016-12-28 NOTE — Telephone Encounter (Signed)
Spoke with pt and rescheduled SD[facility] to 01/24/17 12:00 CT will be scheduled Nothing further needed

## 2016-12-28 NOTE — Telephone Encounter (Signed)
Routing to lung nodule pool for follow up.  

## 2017-01-22 ENCOUNTER — Telehealth: Payer: Self-pay | Admitting: Acute Care

## 2017-01-22 NOTE — Telephone Encounter (Signed)
Pt called Ct to resched shared decision visit. Ct snet msg to [facility]. Cb is [phone].

## 2017-01-22 NOTE — Telephone Encounter (Signed)
Mark Espinoza, please reschedule this patient's screening CT scan. Thanks so much.

## 2017-01-22 NOTE — Telephone Encounter (Signed)
FYI SG

## 2017-01-23 NOTE — Telephone Encounter (Signed)
Pt is calling back to schedule his appt for the Lung Screening [phone]

## 2017-01-23 NOTE — Telephone Encounter (Signed)
I want to make sure Langley Gauss saw this. Will route.

## 2017-01-23 NOTE — Telephone Encounter (Signed)
Spoke with pt and rescheduled [facility] 03/05/17 2:00 CT will be rescheduled Nothing further needed

## 2017-01-24 ENCOUNTER — Encounter: Payer: Medicare Other | Admitting: Acute Care

## 2017-01-24 ENCOUNTER — Inpatient Hospital Stay: Admission: RE | Admit: 2017-01-24 | Payer: Medicare Other | Source: Ambulatory Visit

## 2017-03-05 ENCOUNTER — Ambulatory Visit (INDEPENDENT_AMBULATORY_CARE_PROVIDER_SITE_OTHER)
Admission: RE | Admit: 2017-03-05 | Discharge: 2017-03-05 | Disposition: A | Discharge status: Home / Self Care | Payer: Medicare HMO | Source: Ambulatory Visit | Attending: Acute Care | Admitting: Acute Care

## 2017-03-05 ENCOUNTER — Ambulatory Visit (INDEPENDENT_AMBULATORY_CARE_PROVIDER_SITE_OTHER): Payer: Medicare HMO | Admitting: Acute Care

## 2017-03-05 ENCOUNTER — Encounter: Payer: Self-pay | Admitting: Acute Care

## 2017-03-05 DIAGNOSIS — F17210 Nicotine dependence, cigarettes, uncomplicated: Secondary | ICD-10-CM

## 2017-03-05 DIAGNOSIS — Z87891 Personal history of nicotine dependence: Secondary | ICD-10-CM | POA: Diagnosis not present

## 2017-03-05 DIAGNOSIS — Z122 Encounter for screening for malignant neoplasm of respiratory organs: Secondary | ICD-10-CM

## 2017-03-05 DIAGNOSIS — R69 Illness, unspecified: Secondary | ICD-10-CM | POA: Diagnosis not present

## 2017-03-05 NOTE — Progress Notes (Signed)
Shared Decision Making Visit Lung Cancer Screening Program [phone])   Eligibility:  Age 72 y.o.  Pack Years Smoking History Calculation 52 pack year smoking history (# packs/per year x # years smoked)  Recent History of coughing up blood  no  Unexplained weight loss? no ( >Than 15 pounds within the last 6 months )  Prior History Lung / other cancer no (Diagnosis within the last 5 years already requiring surveillance chest CT Scans).  Smoking Status Current Smoker  Former Smokers: Years since quit: NA  Quit Date: NA  Visit Components:  Discussion included one or more decision making aids. yes  Discussion included risk/benefits of screening. yes  Discussion included potential follow up diagnostic testing for abnormal scans. yes  Discussion included meaning and risk of over diagnosis. yes  Discussion included meaning and risk of False Positives. yes  Discussion included meaning of total radiation exposure. yes  Counseling Included:  Importance of adherence to annual lung cancer LDCT screening. yes  Impact of comorbidities on ability to participate in the program. yes  Ability and willingness to under diagnostic treatment. yes  Smoking Cessation Counseling:  Current Smokers:   Discussed importance of smoking cessation. yes  Information about tobacco cessation classes and interventions provided to patient. yes  Patient provided with "ticket" for LDCT Scan. yes  Symptomatic Patient. no  Counseling  Diagnosis Code: Tobacco Use Z72.0  Asymptomatic Patient yes  Counseling (Intermediate counseling: > three minutes counseling) K2706  Former Smokers:   Discussed the importance of maintaining cigarette abstinence. yes  Diagnosis Code: Personal History of Nicotine Dependence. C37.628  Information about tobacco cessation classes and interventions provided to patient. Yes  Patient provided with "ticket" for LDCT Scan. yes  Written Order for Lung Cancer  Screening with LDCT placed in Epic. Yes (CT Chest Lung Cancer Screening Low Dose W/O CM) [id number] Z12.2-Screening of respiratory organs Z87.891-Personal history of nicotine dependence  I have spent 25 minutes of face to face time with Mark Espinoza discussing the risks and benefits of lung cancer screening. We viewed a power point together that explained in detail the above noted topics. We paused at intervals to allow for questions to be asked and answered to ensure understanding.We discussed that the single most powerful action that he can take to decrease his risk of developing lung cancer is to quit smoking. We discussed whether or not he is ready to commit to setting a quit date.He is currently not ready to set a quit date. We discussed options for tools to aid in quitting smoking including nicotine replacement therapy, non-nicotine medications, support groups, Quit Smart classes, and behavior modification. We discussed that often times setting smaller, more achievable goals, such as eliminating 1 cigarette a day for a week and then 2 cigarettes a day for a week can be helpful in slowly decreasing the number of cigarettes smoked. This allows for a sense of accomplishment as well as providing a clinical benefit. I gave him the " Be Stronger Than Your Excuses" card with contact information for community resources, classes, free nicotine replacement therapy, and access to mobile apps, text messaging, and on-line smoking cessation help. I have also given him my card and contact information in the event he needs to contact me. We discussed the time and location of the scan, and that either Doroteo Glassman RN or I will call with the results within 24-48 hours of receiving them. I have offered him  a copy of the power point we viewed  as  a resource in the event they need reinforcement of the concepts we discussed today in the office. The patient verbalized understanding of all of  the above and had no further  questions upon leaving the office. They have my contact information in the event they have any further questions.  I spent 4 minutes counseling on smoking cessation and the health risks of continued tobacco abuse.  I explained to the patient that there has been a high incidence of coronary artery disease noted on these exams. I explained that this is a non-gated exam therefore degree or severity cannot be determined. This patient is currently on statin therapy. I have asked the patient to follow-up with their PCP regarding any incidental finding of coronary artery disease and management with diet or medication as their PCP  feels is clinically indicated. The patient verbalized understanding of the above and had no further questions upon completion of the visit.      Mark Spatz, NP 03/05/2017 4:12 PM

## 2017-03-07 ENCOUNTER — Other Ambulatory Visit: Payer: Self-pay | Admitting: Acute Care

## 2017-03-07 DIAGNOSIS — F17210 Nicotine dependence, cigarettes, uncomplicated: Secondary | ICD-10-CM

## 2017-03-07 DIAGNOSIS — Z122 Encounter for screening for malignant neoplasm of respiratory organs: Secondary | ICD-10-CM

## 2017-04-19 DIAGNOSIS — Z6825 Body mass index (BMI) 25.0-25.9, adult: Secondary | ICD-10-CM | POA: Diagnosis not present

## 2017-04-19 DIAGNOSIS — I10 Essential (primary) hypertension: Secondary | ICD-10-CM | POA: Diagnosis not present

## 2017-04-19 DIAGNOSIS — Z1159 Encounter for screening for other viral diseases: Secondary | ICD-10-CM | POA: Diagnosis not present

## 2017-04-19 DIAGNOSIS — K219 Gastro-esophageal reflux disease without esophagitis: Secondary | ICD-10-CM | POA: Diagnosis not present

## 2017-04-19 DIAGNOSIS — R69 Illness, unspecified: Secondary | ICD-10-CM | POA: Diagnosis not present

## 2017-04-19 DIAGNOSIS — G479 Sleep disorder, unspecified: Secondary | ICD-10-CM | POA: Diagnosis not present

## 2017-04-19 DIAGNOSIS — E782 Mixed hyperlipidemia: Secondary | ICD-10-CM | POA: Diagnosis not present

## 2017-04-19 DIAGNOSIS — E1122 Type 2 diabetes mellitus with diabetic chronic kidney disease: Secondary | ICD-10-CM | POA: Diagnosis not present

## 2017-04-19 DIAGNOSIS — N400 Enlarged prostate without lower urinary tract symptoms: Secondary | ICD-10-CM | POA: Diagnosis not present

## 2017-04-19 DIAGNOSIS — I2510 Atherosclerotic heart disease of native coronary artery without angina pectoris: Secondary | ICD-10-CM | POA: Diagnosis not present

## 2017-04-19 DIAGNOSIS — N182 Chronic kidney disease, stage 2 (mild): Secondary | ICD-10-CM | POA: Diagnosis not present

## 2017-04-25 DIAGNOSIS — R69 Illness, unspecified: Secondary | ICD-10-CM | POA: Diagnosis not present

## 2017-05-02 DIAGNOSIS — Z1389 Encounter for screening for other disorder: Secondary | ICD-10-CM | POA: Diagnosis not present

## 2017-05-02 DIAGNOSIS — J449 Chronic obstructive pulmonary disease, unspecified: Secondary | ICD-10-CM | POA: Diagnosis not present

## 2017-05-02 DIAGNOSIS — Z86010 Personal history of colonic polyps: Secondary | ICD-10-CM | POA: Diagnosis not present

## 2017-05-02 DIAGNOSIS — E1122 Type 2 diabetes mellitus with diabetic chronic kidney disease: Secondary | ICD-10-CM | POA: Diagnosis not present

## 2017-05-02 DIAGNOSIS — R69 Illness, unspecified: Secondary | ICD-10-CM | POA: Diagnosis not present

## 2017-05-02 DIAGNOSIS — Z7189 Other specified counseling: Secondary | ICD-10-CM | POA: Diagnosis not present

## 2017-05-02 DIAGNOSIS — I10 Essential (primary) hypertension: Secondary | ICD-10-CM | POA: Diagnosis not present

## 2017-05-02 DIAGNOSIS — I2510 Atherosclerotic heart disease of native coronary artery without angina pectoris: Secondary | ICD-10-CM | POA: Diagnosis not present

## 2017-05-02 DIAGNOSIS — Z0000 Encounter for general adult medical examination without abnormal findings: Secondary | ICD-10-CM | POA: Diagnosis not present

## 2017-05-02 DIAGNOSIS — E782 Mixed hyperlipidemia: Secondary | ICD-10-CM | POA: Diagnosis not present

## 2017-07-30 DIAGNOSIS — H2513 Age-related nuclear cataract, bilateral: Secondary | ICD-10-CM | POA: Diagnosis not present

## 2017-07-30 DIAGNOSIS — E119 Type 2 diabetes mellitus without complications: Secondary | ICD-10-CM | POA: Diagnosis not present

## 2017-07-30 DIAGNOSIS — H538 Other visual disturbances: Secondary | ICD-10-CM | POA: Diagnosis not present

## 2017-07-30 DIAGNOSIS — H52223 Regular astigmatism, bilateral: Secondary | ICD-10-CM | POA: Diagnosis not present

## 2017-07-30 DIAGNOSIS — H43813 Vitreous degeneration, bilateral: Secondary | ICD-10-CM | POA: Diagnosis not present

## 2017-09-06 DIAGNOSIS — R69 Illness, unspecified: Secondary | ICD-10-CM | POA: Diagnosis not present

## 2017-11-06 DIAGNOSIS — R69 Illness, unspecified: Secondary | ICD-10-CM | POA: Diagnosis not present

## 2017-11-09 DIAGNOSIS — Z6825 Body mass index (BMI) 25.0-25.9, adult: Secondary | ICD-10-CM | POA: Diagnosis not present

## 2017-11-09 DIAGNOSIS — E782 Mixed hyperlipidemia: Secondary | ICD-10-CM | POA: Diagnosis not present

## 2017-11-09 DIAGNOSIS — N182 Chronic kidney disease, stage 2 (mild): Secondary | ICD-10-CM | POA: Diagnosis not present

## 2017-11-09 DIAGNOSIS — R69 Illness, unspecified: Secondary | ICD-10-CM | POA: Diagnosis not present

## 2017-11-09 DIAGNOSIS — I10 Essential (primary) hypertension: Secondary | ICD-10-CM | POA: Diagnosis not present

## 2017-11-09 DIAGNOSIS — E1122 Type 2 diabetes mellitus with diabetic chronic kidney disease: Secondary | ICD-10-CM | POA: Diagnosis not present

## 2017-11-09 DIAGNOSIS — K219 Gastro-esophageal reflux disease without esophagitis: Secondary | ICD-10-CM | POA: Diagnosis not present

## 2017-11-09 DIAGNOSIS — G479 Sleep disorder, unspecified: Secondary | ICD-10-CM | POA: Diagnosis not present

## 2017-11-09 DIAGNOSIS — Z1159 Encounter for screening for other viral diseases: Secondary | ICD-10-CM | POA: Diagnosis not present

## 2017-11-09 DIAGNOSIS — I2510 Atherosclerotic heart disease of native coronary artery without angina pectoris: Secondary | ICD-10-CM | POA: Diagnosis not present

## 2017-11-22 DIAGNOSIS — I10 Essential (primary) hypertension: Secondary | ICD-10-CM | POA: Diagnosis not present

## 2017-11-22 DIAGNOSIS — N183 Chronic kidney disease, stage 3 (moderate): Secondary | ICD-10-CM | POA: Diagnosis not present

## 2017-11-22 DIAGNOSIS — Z23 Encounter for immunization: Secondary | ICD-10-CM | POA: Diagnosis not present

## 2017-11-22 DIAGNOSIS — I2510 Atherosclerotic heart disease of native coronary artery without angina pectoris: Secondary | ICD-10-CM | POA: Diagnosis not present

## 2017-11-22 DIAGNOSIS — R69 Illness, unspecified: Secondary | ICD-10-CM | POA: Diagnosis not present

## 2017-11-22 DIAGNOSIS — E782 Mixed hyperlipidemia: Secondary | ICD-10-CM | POA: Diagnosis not present

## 2017-11-22 DIAGNOSIS — E1122 Type 2 diabetes mellitus with diabetic chronic kidney disease: Secondary | ICD-10-CM | POA: Diagnosis not present

## 2017-11-22 DIAGNOSIS — K219 Gastro-esophageal reflux disease without esophagitis: Secondary | ICD-10-CM | POA: Diagnosis not present

## 2017-11-22 DIAGNOSIS — J449 Chronic obstructive pulmonary disease, unspecified: Secondary | ICD-10-CM | POA: Diagnosis not present

## 2017-11-22 DIAGNOSIS — G479 Sleep disorder, unspecified: Secondary | ICD-10-CM | POA: Diagnosis not present

## 2017-12-11 DIAGNOSIS — N5201 Erectile dysfunction due to arterial insufficiency: Secondary | ICD-10-CM | POA: Diagnosis not present

## 2017-12-11 DIAGNOSIS — R3915 Urgency of urination: Secondary | ICD-10-CM | POA: Diagnosis not present

## 2017-12-11 DIAGNOSIS — N401 Enlarged prostate with lower urinary tract symptoms: Secondary | ICD-10-CM | POA: Diagnosis not present

## 2017-12-30 DIAGNOSIS — Z0100 Encounter for examination of eyes and vision without abnormal findings: Secondary | ICD-10-CM | POA: Diagnosis not present

## 2018-03-06 ENCOUNTER — Inpatient Hospital Stay: Admission: RE | Admit: 2018-03-06 | Payer: Medicare HMO | Source: Ambulatory Visit

## 2018-04-01 ENCOUNTER — Inpatient Hospital Stay: Admission: RE | Admit: 2018-04-01 | Payer: Medicare HMO | Source: Ambulatory Visit

## 2018-06-07 ENCOUNTER — Other Ambulatory Visit: Payer: Self-pay | Admitting: Acute Care

## 2018-06-07 DIAGNOSIS — Z87891 Personal history of nicotine dependence: Secondary | ICD-10-CM

## 2018-06-07 DIAGNOSIS — Z122 Encounter for screening for malignant neoplasm of respiratory organs: Secondary | ICD-10-CM

## 2018-06-07 DIAGNOSIS — F17210 Nicotine dependence, cigarettes, uncomplicated: Secondary | ICD-10-CM

## 2018-06-26 ENCOUNTER — Encounter: Payer: Self-pay | Admitting: Acute Care

## 2019-01-31 ENCOUNTER — Ambulatory Visit: Payer: Self-pay | Attending: Internal Medicine

## 2019-01-31 DIAGNOSIS — Z23 Encounter for immunization: Secondary | ICD-10-CM | POA: Insufficient documentation

## 2019-01-31 NOTE — Progress Notes (Signed)
   Covid-19 Vaccination Clinic  Name:  Mark Espinoza    MRN: [id number] DOB: Aug 06, 1945  01/31/2019  [name] was observed post Covid-19 immunization for 15 minutes without incidence. He was provided with Vaccine Information Sheet and instruction to access the V-Safe system.   [name] was instructed to call 911 with any severe reactions post vaccine: Marland Kitchen Difficulty breathing  . Swelling of your face and throat  . A fast heartbeat  . A bad rash all over your body  . Dizziness and weakness    Immunizations Administered    Name Date Dose VIS Date Route   Pfizer COVID-19 Vaccine 01/31/2019  2:44 PM 0.3 mL 12/20/2018 Intramuscular   Manufacturer: [place]   Lot: [id number]   [place]: [id number]

## 2019-02-18 ENCOUNTER — Ambulatory Visit: Payer: Self-pay | Attending: Internal Medicine

## 2019-02-18 DIAGNOSIS — Z23 Encounter for immunization: Secondary | ICD-10-CM | POA: Insufficient documentation

## 2019-02-18 NOTE — Progress Notes (Signed)
   Covid-19 Vaccination Clinic  Name:  Mark Espinoza    MRN: [id number] DOB: 1945-12-20  02/18/2019  [name] was observed post Covid-19 immunization for 15 minutes without incidence. He was provided with Vaccine Information Sheet and instruction to access the V-Safe system.   [name] was instructed to call 911 with any severe reactions post vaccine: Marland Kitchen Difficulty breathing  . Swelling of your face and throat  . A fast heartbeat  . A bad rash all over your body  . Dizziness and weakness    Immunizations Administered    Name Date Dose VIS Date Route   Pfizer COVID-19 Vaccine 02/18/2019  9:09 AM 0.3 mL 12/20/2018 Intramuscular   Manufacturer: [place]   Lot: [id number]   [place]: [id number]

## 2019-08-11 ENCOUNTER — Other Ambulatory Visit: Payer: Self-pay | Admitting: *Deleted

## 2019-08-11 DIAGNOSIS — F17210 Nicotine dependence, cigarettes, uncomplicated: Secondary | ICD-10-CM

## 2019-08-11 DIAGNOSIS — Z87891 Personal history of nicotine dependence: Secondary | ICD-10-CM

## 2019-10-09 ENCOUNTER — Encounter: Payer: Self-pay | Admitting: Acute Care

## 2019-12-10 IMAGING — CT CT CHEST LUNG CANCER SCREENING LOW DOSE W/O CM
2 of 4 series · 15 of 40 positions shown, 18 images · non-contrast
Comparison: None.

CLINICAL DATA: 71-year-old male with 52 pack-year history of
smoking. Lung cancer screening.

EXAM:
CT CHEST WITHOUT CONTRAST LOW-DOSE FOR LUNG CANCER SCREENING
TECHNIQUE: Multidetector CT imaging of the chest was performed following the
standard protocol without IV contrast.

[Series 2: thorax 5.0 i31f 3 · axial · 0.76mm/px · z∈[-312,-57]mm · 12 of 61 slices shown, 15 images]
[im 5/61  mediastinal]
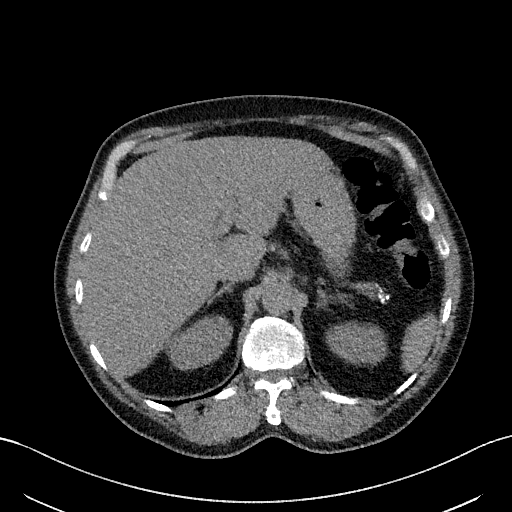
[im 5/61  lung]
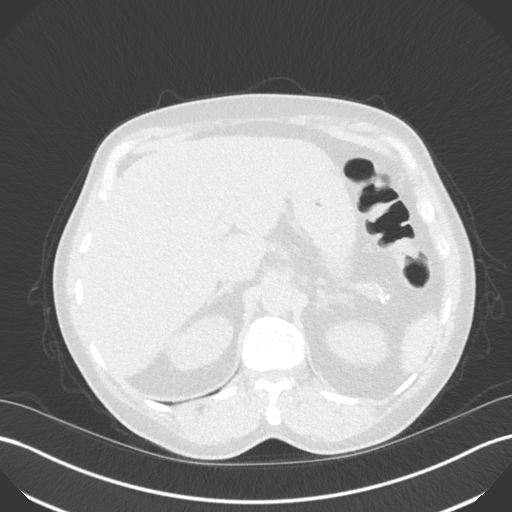
[im 10/61  lung]
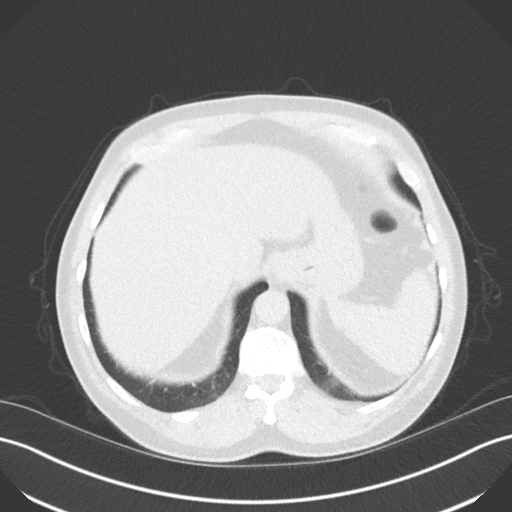
[im 14/61  lung]
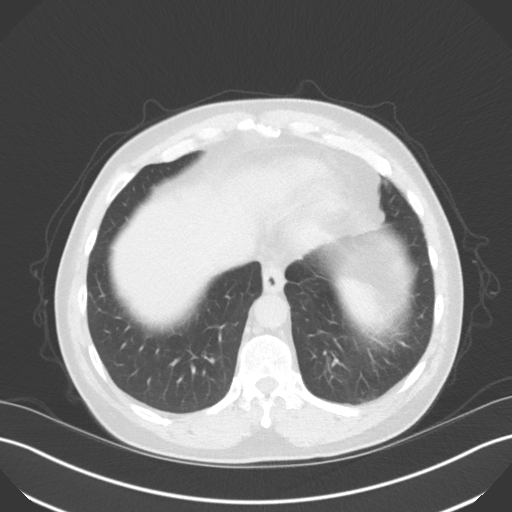
[im 19/61  lung]
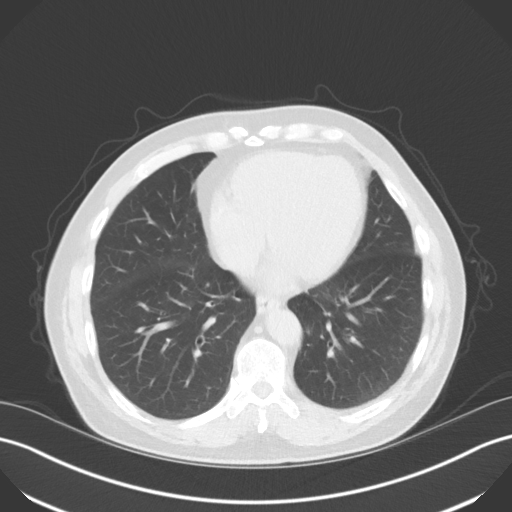
[im 24/61  mediastinal]
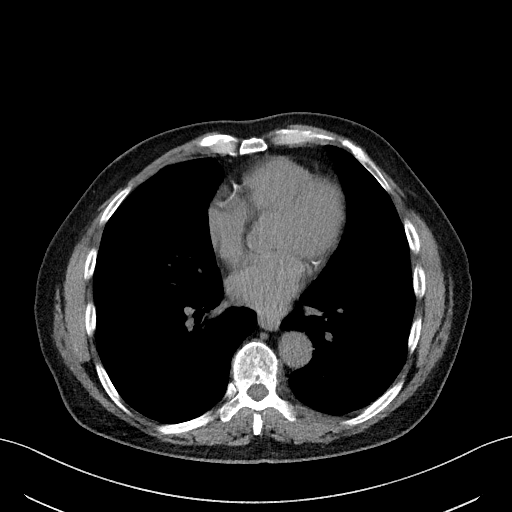
[im 24/61  lung]
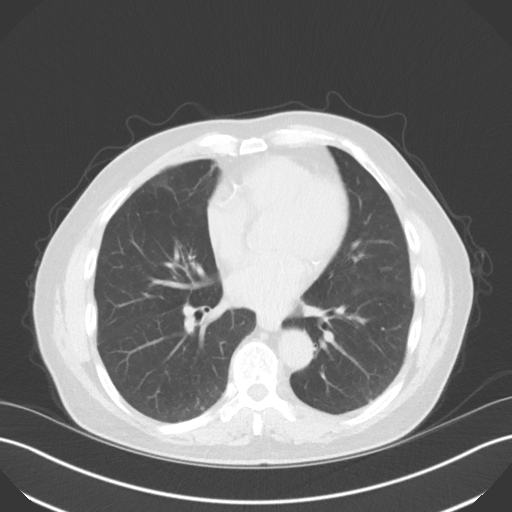
[im 28/61  lung]
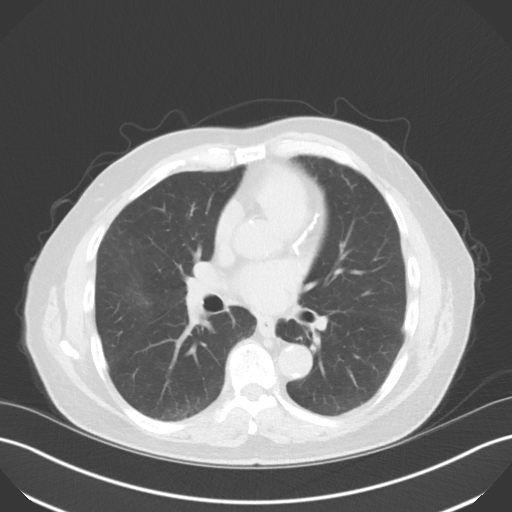
[im 33/61  lung]
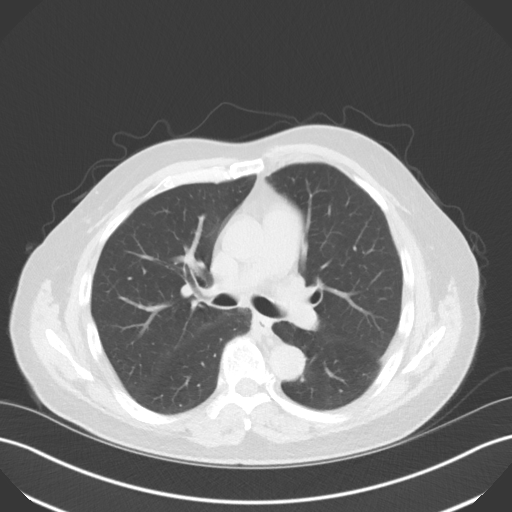
[im 37/61  lung]
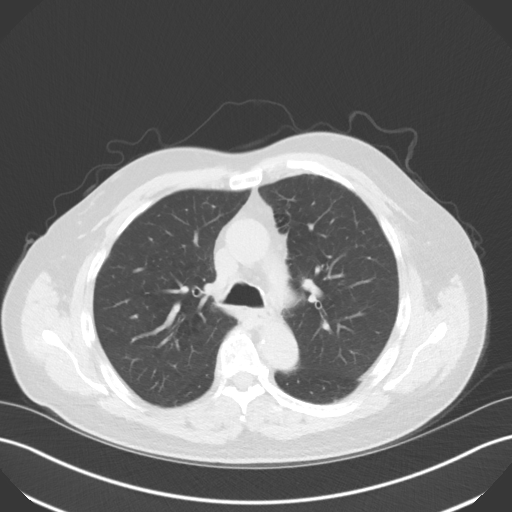
[im 42/61  mediastinal]
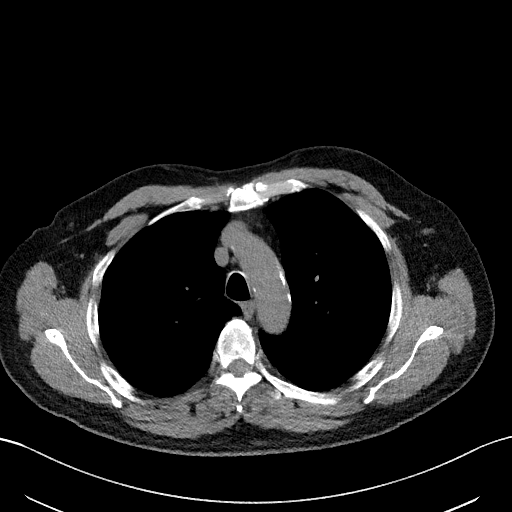
[im 42/61  lung]
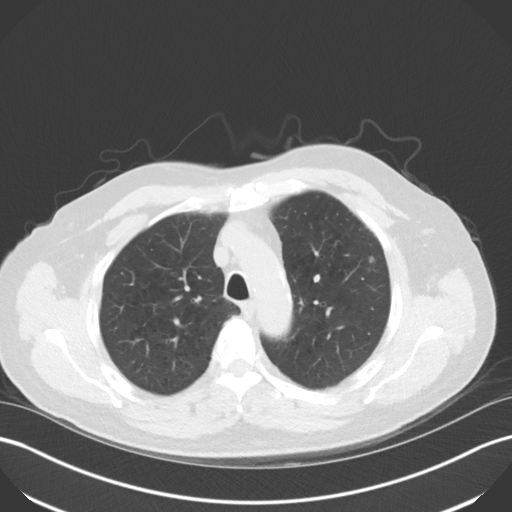
[im 47/61  lung]
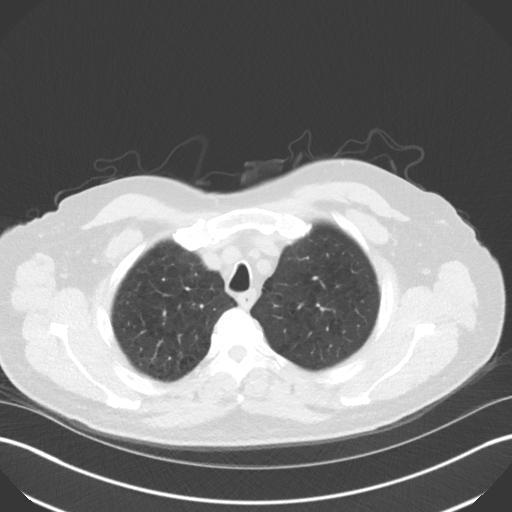
[im 51/61  lung]
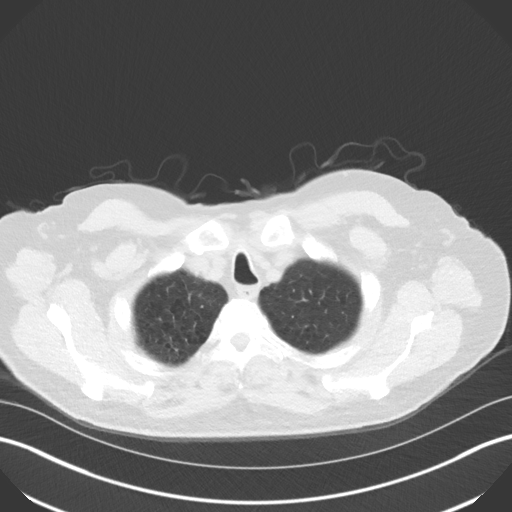
[im 56/61  lung]
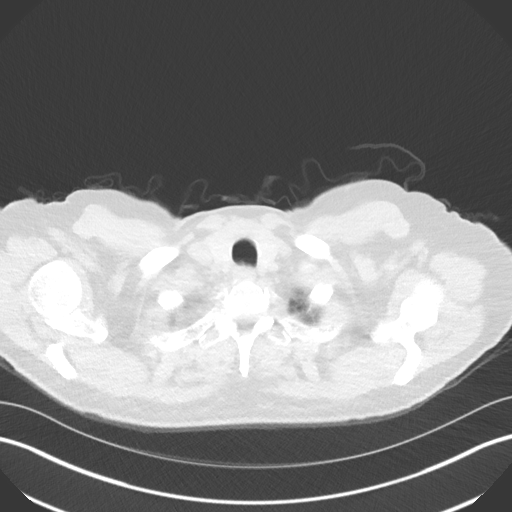

[Series 5: coronal · coronal · 0.59mm/px · 3 of 128 slices shown]
[im 26/128  lung]
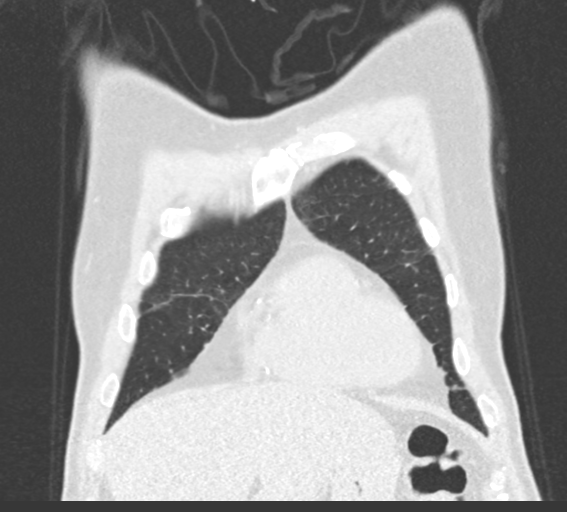
[im 51/128  lung]
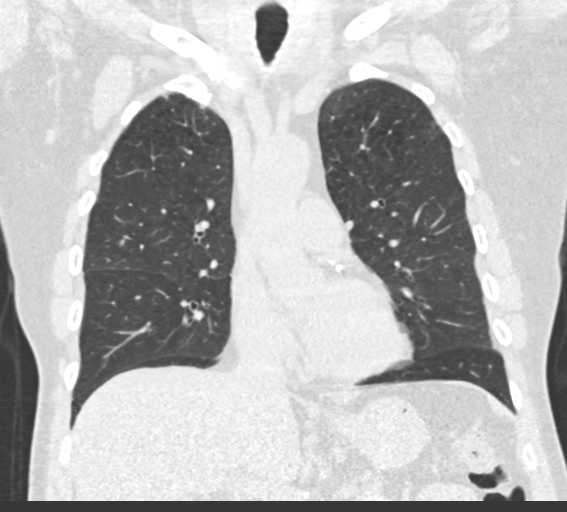
[im 77/128  lung]
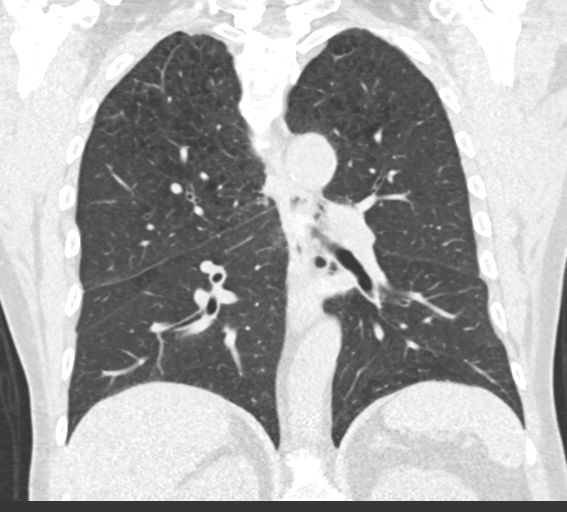

[15 of 40 positions shown; findings below may reference images not displayed]

FINDINGS: Cardiovascular: The heart size is normal. No pericardial effusion.
Coronary artery calcification is evident. Atherosclerotic
calcification is noted in the wall of the thoracic aorta.

Mediastinum/Nodes: No mediastinal lymphadenopathy. No evidence for
gross hilar lymphadenopathy although assessment is limited by the
lack of intravenous contrast on today's study. The esophagus has
normal imaging features. There is no axillary lymphadenopathy.

Lungs/Pleura: Scattered tiny pulmonary nodules are identified
measuring up to maximum volume derived diameter 5.8 mm. Atelectasis
or scarring noted posterior left lower lobe. No focal airspace
consolidation. No pulmonary edema or pleural effusion. Centrilobular
emphysema is associated with bronchial wall thickening.

Upper Abdomen: Incompletely visualized pancreas shows diffusely
dystrophic calcification compatible with chronic pancreatitis.

Musculoskeletal: Bone windows reveal no worrisome lytic or sclerotic
osseous lesions.
IMPRESSION: 1. Lung-RADS 2, benign appearance or behavior. Continue annual
screening with low-dose chest CT without contrast in 12 months.
2.  Emphysema. (IPW9H-8EM.3)
3.  Aortic Atherosclerois (IPW9H-170.0)
4. Pancreatic calcification compatible with chronic pancreatitis.

## 2020-02-09 DIAGNOSIS — E1122 Type 2 diabetes mellitus with diabetic chronic kidney disease: Secondary | ICD-10-CM | POA: Diagnosis not present

## 2020-02-09 DIAGNOSIS — I10 Essential (primary) hypertension: Secondary | ICD-10-CM | POA: Diagnosis not present

## 2020-02-09 DIAGNOSIS — R808 Other proteinuria: Secondary | ICD-10-CM | POA: Diagnosis not present

## 2020-02-16 DIAGNOSIS — I10 Essential (primary) hypertension: Secondary | ICD-10-CM | POA: Diagnosis not present

## 2020-02-16 DIAGNOSIS — J302 Other seasonal allergic rhinitis: Secondary | ICD-10-CM | POA: Diagnosis not present

## 2020-02-16 DIAGNOSIS — F17210 Nicotine dependence, cigarettes, uncomplicated: Secondary | ICD-10-CM | POA: Diagnosis not present

## 2020-02-16 DIAGNOSIS — G479 Sleep disorder, unspecified: Secondary | ICD-10-CM | POA: Diagnosis not present

## 2020-02-16 DIAGNOSIS — E782 Mixed hyperlipidemia: Secondary | ICD-10-CM | POA: Diagnosis not present

## 2020-02-16 DIAGNOSIS — I2510 Atherosclerotic heart disease of native coronary artery without angina pectoris: Secondary | ICD-10-CM | POA: Diagnosis not present

## 2020-02-16 DIAGNOSIS — N182 Chronic kidney disease, stage 2 (mild): Secondary | ICD-10-CM | POA: Diagnosis not present

## 2020-02-16 DIAGNOSIS — J449 Chronic obstructive pulmonary disease, unspecified: Secondary | ICD-10-CM | POA: Diagnosis not present

## 2020-02-16 DIAGNOSIS — K219 Gastro-esophageal reflux disease without esophagitis: Secondary | ICD-10-CM | POA: Diagnosis not present

## 2020-02-16 DIAGNOSIS — E1122 Type 2 diabetes mellitus with diabetic chronic kidney disease: Secondary | ICD-10-CM | POA: Diagnosis not present

## 2020-03-04 ENCOUNTER — Encounter: Payer: Self-pay | Admitting: General Practice

## 2020-08-12 DIAGNOSIS — N200 Calculus of kidney: Secondary | ICD-10-CM | POA: Diagnosis not present

## 2020-08-12 DIAGNOSIS — R3912 Poor urinary stream: Secondary | ICD-10-CM | POA: Diagnosis not present

## 2020-08-23 DIAGNOSIS — Z0000 Encounter for general adult medical examination without abnormal findings: Secondary | ICD-10-CM | POA: Diagnosis not present

## 2020-09-14 DIAGNOSIS — J449 Chronic obstructive pulmonary disease, unspecified: Secondary | ICD-10-CM | POA: Diagnosis not present

## 2020-09-14 DIAGNOSIS — R808 Other proteinuria: Secondary | ICD-10-CM | POA: Diagnosis not present

## 2020-09-14 DIAGNOSIS — B353 Tinea pedis: Secondary | ICD-10-CM | POA: Diagnosis not present

## 2020-09-14 DIAGNOSIS — Z7984 Long term (current) use of oral hypoglycemic drugs: Secondary | ICD-10-CM | POA: Diagnosis not present

## 2020-09-14 DIAGNOSIS — Z122 Encounter for screening for malignant neoplasm of respiratory organs: Secondary | ICD-10-CM | POA: Diagnosis not present

## 2020-09-14 DIAGNOSIS — F17210 Nicotine dependence, cigarettes, uncomplicated: Secondary | ICD-10-CM | POA: Diagnosis not present

## 2020-09-14 DIAGNOSIS — I2510 Atherosclerotic heart disease of native coronary artery without angina pectoris: Secondary | ICD-10-CM | POA: Diagnosis not present

## 2020-09-14 DIAGNOSIS — I10 Essential (primary) hypertension: Secondary | ICD-10-CM | POA: Diagnosis not present

## 2020-09-14 DIAGNOSIS — Z79899 Other long term (current) drug therapy: Secondary | ICD-10-CM | POA: Diagnosis not present

## 2020-09-14 DIAGNOSIS — G479 Sleep disorder, unspecified: Secondary | ICD-10-CM | POA: Diagnosis not present

## 2020-09-14 DIAGNOSIS — E1122 Type 2 diabetes mellitus with diabetic chronic kidney disease: Secondary | ICD-10-CM | POA: Diagnosis not present

## 2020-09-14 DIAGNOSIS — E782 Mixed hyperlipidemia: Secondary | ICD-10-CM | POA: Diagnosis not present

## 2020-09-20 DIAGNOSIS — E1122 Type 2 diabetes mellitus with diabetic chronic kidney disease: Secondary | ICD-10-CM | POA: Diagnosis not present

## 2020-09-20 DIAGNOSIS — E782 Mixed hyperlipidemia: Secondary | ICD-10-CM | POA: Diagnosis not present

## 2020-09-20 DIAGNOSIS — G479 Sleep disorder, unspecified: Secondary | ICD-10-CM | POA: Diagnosis not present

## 2020-09-20 DIAGNOSIS — Z23 Encounter for immunization: Secondary | ICD-10-CM | POA: Diagnosis not present

## 2020-09-20 DIAGNOSIS — I10 Essential (primary) hypertension: Secondary | ICD-10-CM | POA: Diagnosis not present

## 2020-09-20 DIAGNOSIS — R809 Proteinuria, unspecified: Secondary | ICD-10-CM | POA: Diagnosis not present

## 2020-09-20 DIAGNOSIS — I2510 Atherosclerotic heart disease of native coronary artery without angina pectoris: Secondary | ICD-10-CM | POA: Diagnosis not present

## 2020-09-20 DIAGNOSIS — K219 Gastro-esophageal reflux disease without esophagitis: Secondary | ICD-10-CM | POA: Diagnosis not present

## 2020-09-20 DIAGNOSIS — Z7984 Long term (current) use of oral hypoglycemic drugs: Secondary | ICD-10-CM | POA: Diagnosis not present

## 2020-09-20 DIAGNOSIS — K5900 Constipation, unspecified: Secondary | ICD-10-CM | POA: Diagnosis not present

## 2020-09-20 DIAGNOSIS — L209 Atopic dermatitis, unspecified: Secondary | ICD-10-CM | POA: Diagnosis not present

## 2020-10-19 DIAGNOSIS — H538 Other visual disturbances: Secondary | ICD-10-CM | POA: Diagnosis not present

## 2020-10-19 DIAGNOSIS — H43812 Vitreous degeneration, left eye: Secondary | ICD-10-CM | POA: Diagnosis not present

## 2020-10-19 DIAGNOSIS — H43813 Vitreous degeneration, bilateral: Secondary | ICD-10-CM | POA: Diagnosis not present

## 2020-10-19 DIAGNOSIS — H2513 Age-related nuclear cataract, bilateral: Secondary | ICD-10-CM | POA: Diagnosis not present

## 2020-12-22 DIAGNOSIS — R531 Weakness: Secondary | ICD-10-CM | POA: Diagnosis not present

## 2020-12-22 DIAGNOSIS — I959 Hypotension, unspecified: Secondary | ICD-10-CM | POA: Diagnosis not present

## 2020-12-23 DIAGNOSIS — K921 Melena: Secondary | ICD-10-CM | POA: Diagnosis not present

## 2020-12-23 DIAGNOSIS — D509 Iron deficiency anemia, unspecified: Secondary | ICD-10-CM | POA: Diagnosis not present

## 2020-12-23 DIAGNOSIS — K920 Hematemesis: Secondary | ICD-10-CM | POA: Diagnosis not present

## 2020-12-23 DIAGNOSIS — Z86010 Personal history of colonic polyps: Secondary | ICD-10-CM | POA: Diagnosis not present

## 2020-12-28 DIAGNOSIS — D649 Anemia, unspecified: Secondary | ICD-10-CM | POA: Diagnosis not present

## 2020-12-29 DIAGNOSIS — K920 Hematemesis: Secondary | ICD-10-CM | POA: Diagnosis not present

## 2020-12-29 DIAGNOSIS — K6389 Other specified diseases of intestine: Secondary | ICD-10-CM | POA: Diagnosis not present

## 2020-12-29 DIAGNOSIS — D128 Benign neoplasm of rectum: Secondary | ICD-10-CM | POA: Diagnosis not present

## 2020-12-29 DIAGNOSIS — K449 Diaphragmatic hernia without obstruction or gangrene: Secondary | ICD-10-CM | POA: Diagnosis not present

## 2020-12-29 DIAGNOSIS — D509 Iron deficiency anemia, unspecified: Secondary | ICD-10-CM | POA: Diagnosis not present

## 2020-12-29 DIAGNOSIS — K649 Unspecified hemorrhoids: Secondary | ICD-10-CM | POA: Diagnosis not present

## 2020-12-29 DIAGNOSIS — K5730 Diverticulosis of large intestine without perforation or abscess without bleeding: Secondary | ICD-10-CM | POA: Diagnosis not present

## 2020-12-29 DIAGNOSIS — Z86010 Personal history of colonic polyps: Secondary | ICD-10-CM | POA: Diagnosis not present

## 2020-12-29 DIAGNOSIS — K3189 Other diseases of stomach and duodenum: Secondary | ICD-10-CM | POA: Diagnosis not present

## 2021-01-04 DIAGNOSIS — D128 Benign neoplasm of rectum: Secondary | ICD-10-CM | POA: Diagnosis not present

## 2021-01-19 DIAGNOSIS — D509 Iron deficiency anemia, unspecified: Secondary | ICD-10-CM | POA: Diagnosis not present

## 2021-01-28 DIAGNOSIS — D509 Iron deficiency anemia, unspecified: Secondary | ICD-10-CM | POA: Diagnosis not present

## 2021-01-28 DIAGNOSIS — K3189 Other diseases of stomach and duodenum: Secondary | ICD-10-CM | POA: Diagnosis not present

## 2021-01-28 DIAGNOSIS — I10 Essential (primary) hypertension: Secondary | ICD-10-CM | POA: Diagnosis not present

## 2021-02-03 DIAGNOSIS — K3189 Other diseases of stomach and duodenum: Secondary | ICD-10-CM | POA: Diagnosis not present

## 2021-02-03 DIAGNOSIS — Z86010 Personal history of colonic polyps: Secondary | ICD-10-CM | POA: Diagnosis not present

## 2021-02-03 DIAGNOSIS — D509 Iron deficiency anemia, unspecified: Secondary | ICD-10-CM | POA: Diagnosis not present

## 2021-02-03 DIAGNOSIS — K259 Gastric ulcer, unspecified as acute or chronic, without hemorrhage or perforation: Secondary | ICD-10-CM | POA: Diagnosis not present

## 2021-02-03 DIAGNOSIS — H538 Other visual disturbances: Secondary | ICD-10-CM | POA: Diagnosis not present

## 2021-02-03 DIAGNOSIS — K5900 Constipation, unspecified: Secondary | ICD-10-CM | POA: Diagnosis not present

## 2021-02-23 DIAGNOSIS — K259 Gastric ulcer, unspecified as acute or chronic, without hemorrhage or perforation: Secondary | ICD-10-CM | POA: Diagnosis not present

## 2021-04-04 DIAGNOSIS — K219 Gastro-esophageal reflux disease without esophagitis: Secondary | ICD-10-CM | POA: Diagnosis not present

## 2021-04-04 DIAGNOSIS — R809 Proteinuria, unspecified: Secondary | ICD-10-CM | POA: Diagnosis not present

## 2021-04-04 DIAGNOSIS — I10 Essential (primary) hypertension: Secondary | ICD-10-CM | POA: Diagnosis not present

## 2021-04-04 DIAGNOSIS — Z23 Encounter for immunization: Secondary | ICD-10-CM | POA: Diagnosis not present

## 2021-04-04 DIAGNOSIS — J449 Chronic obstructive pulmonary disease, unspecified: Secondary | ICD-10-CM | POA: Diagnosis not present

## 2021-04-04 DIAGNOSIS — N182 Chronic kidney disease, stage 2 (mild): Secondary | ICD-10-CM | POA: Diagnosis not present

## 2021-04-04 DIAGNOSIS — I2510 Atherosclerotic heart disease of native coronary artery without angina pectoris: Secondary | ICD-10-CM | POA: Diagnosis not present

## 2021-04-04 DIAGNOSIS — G629 Polyneuropathy, unspecified: Secondary | ICD-10-CM | POA: Diagnosis not present

## 2021-04-04 DIAGNOSIS — E1122 Type 2 diabetes mellitus with diabetic chronic kidney disease: Secondary | ICD-10-CM | POA: Diagnosis not present

## 2021-04-04 DIAGNOSIS — F17210 Nicotine dependence, cigarettes, uncomplicated: Secondary | ICD-10-CM | POA: Diagnosis not present

## 2021-04-04 DIAGNOSIS — D509 Iron deficiency anemia, unspecified: Secondary | ICD-10-CM | POA: Diagnosis not present

## 2021-04-04 DIAGNOSIS — E782 Mixed hyperlipidemia: Secondary | ICD-10-CM | POA: Diagnosis not present

## 2021-04-04 DIAGNOSIS — K5900 Constipation, unspecified: Secondary | ICD-10-CM | POA: Diagnosis not present

## 2021-06-02 DIAGNOSIS — K3189 Other diseases of stomach and duodenum: Secondary | ICD-10-CM | POA: Diagnosis not present

## 2021-06-02 DIAGNOSIS — D509 Iron deficiency anemia, unspecified: Secondary | ICD-10-CM | POA: Diagnosis not present

## 2021-06-02 DIAGNOSIS — R634 Abnormal weight loss: Secondary | ICD-10-CM | POA: Diagnosis not present

## 2021-08-08 DIAGNOSIS — N200 Calculus of kidney: Secondary | ICD-10-CM | POA: Diagnosis not present

## 2021-08-08 DIAGNOSIS — R3912 Poor urinary stream: Secondary | ICD-10-CM | POA: Diagnosis not present

## 2021-08-29 DIAGNOSIS — Z0000 Encounter for general adult medical examination without abnormal findings: Secondary | ICD-10-CM | POA: Diagnosis not present

## 2021-10-17 DIAGNOSIS — E782 Mixed hyperlipidemia: Secondary | ICD-10-CM | POA: Diagnosis not present

## 2021-10-17 DIAGNOSIS — E1122 Type 2 diabetes mellitus with diabetic chronic kidney disease: Secondary | ICD-10-CM | POA: Diagnosis not present

## 2021-10-17 DIAGNOSIS — R809 Proteinuria, unspecified: Secondary | ICD-10-CM | POA: Diagnosis not present

## 2021-10-18 DIAGNOSIS — H538 Other visual disturbances: Secondary | ICD-10-CM | POA: Diagnosis not present

## 2021-10-24 DIAGNOSIS — N182 Chronic kidney disease, stage 2 (mild): Secondary | ICD-10-CM | POA: Diagnosis not present

## 2021-10-24 DIAGNOSIS — E1122 Type 2 diabetes mellitus with diabetic chronic kidney disease: Secondary | ICD-10-CM | POA: Diagnosis not present

## 2021-10-24 DIAGNOSIS — K219 Gastro-esophageal reflux disease without esophagitis: Secondary | ICD-10-CM | POA: Diagnosis not present

## 2021-10-24 DIAGNOSIS — E782 Mixed hyperlipidemia: Secondary | ICD-10-CM | POA: Diagnosis not present

## 2021-10-24 DIAGNOSIS — D509 Iron deficiency anemia, unspecified: Secondary | ICD-10-CM | POA: Diagnosis not present

## 2021-10-24 DIAGNOSIS — Z23 Encounter for immunization: Secondary | ICD-10-CM | POA: Diagnosis not present

## 2021-10-24 DIAGNOSIS — I2510 Atherosclerotic heart disease of native coronary artery without angina pectoris: Secondary | ICD-10-CM | POA: Diagnosis not present

## 2021-10-24 DIAGNOSIS — I10 Essential (primary) hypertension: Secondary | ICD-10-CM | POA: Diagnosis not present

## 2021-10-30 DIAGNOSIS — H60391 Other infective otitis externa, right ear: Secondary | ICD-10-CM | POA: Diagnosis not present

## 2021-10-30 DIAGNOSIS — H6121 Impacted cerumen, right ear: Secondary | ICD-10-CM | POA: Diagnosis not present

## 2021-10-30 DIAGNOSIS — Z6821 Body mass index (BMI) 21.0-21.9, adult: Secondary | ICD-10-CM | POA: Diagnosis not present

## 2021-12-19 DIAGNOSIS — H25813 Combined forms of age-related cataract, bilateral: Secondary | ICD-10-CM | POA: Diagnosis not present

## 2022-02-07 DIAGNOSIS — H25811 Combined forms of age-related cataract, right eye: Secondary | ICD-10-CM | POA: Diagnosis not present

## 2022-02-22 DIAGNOSIS — H2512 Age-related nuclear cataract, left eye: Secondary | ICD-10-CM | POA: Diagnosis not present

## 2022-02-28 DIAGNOSIS — H52222 Regular astigmatism, left eye: Secondary | ICD-10-CM | POA: Diagnosis not present

## 2022-02-28 DIAGNOSIS — H25812 Combined forms of age-related cataract, left eye: Secondary | ICD-10-CM | POA: Diagnosis not present

## 2022-05-01 ENCOUNTER — Ambulatory Visit
Admission: RE | Admit: 2022-05-01 | Discharge: 2022-05-01 | Disposition: A | Discharge status: Home / Self Care | Payer: Medicare Other | Source: Ambulatory Visit | Attending: Family Medicine | Admitting: Family Medicine

## 2022-05-01 ENCOUNTER — Other Ambulatory Visit: Payer: Self-pay | Admitting: Family Medicine

## 2022-05-01 DIAGNOSIS — R809 Proteinuria, unspecified: Secondary | ICD-10-CM | POA: Diagnosis not present

## 2022-05-01 DIAGNOSIS — G479 Sleep disorder, unspecified: Secondary | ICD-10-CM | POA: Diagnosis not present

## 2022-05-01 DIAGNOSIS — Z6822 Body mass index (BMI) 22.0-22.9, adult: Secondary | ICD-10-CM | POA: Diagnosis not present

## 2022-05-01 DIAGNOSIS — R634 Abnormal weight loss: Secondary | ICD-10-CM

## 2022-05-01 DIAGNOSIS — E782 Mixed hyperlipidemia: Secondary | ICD-10-CM | POA: Diagnosis not present

## 2022-05-01 DIAGNOSIS — E1122 Type 2 diabetes mellitus with diabetic chronic kidney disease: Secondary | ICD-10-CM | POA: Diagnosis not present

## 2022-05-01 DIAGNOSIS — J449 Chronic obstructive pulmonary disease, unspecified: Secondary | ICD-10-CM | POA: Diagnosis not present

## 2022-05-01 DIAGNOSIS — Z122 Encounter for screening for malignant neoplasm of respiratory organs: Secondary | ICD-10-CM

## 2022-05-01 DIAGNOSIS — N182 Chronic kidney disease, stage 2 (mild): Secondary | ICD-10-CM | POA: Diagnosis not present

## 2022-05-01 DIAGNOSIS — F17210 Nicotine dependence, cigarettes, uncomplicated: Secondary | ICD-10-CM | POA: Diagnosis not present

## 2022-05-01 DIAGNOSIS — I10 Essential (primary) hypertension: Secondary | ICD-10-CM | POA: Diagnosis not present

## 2022-06-19 ENCOUNTER — Ambulatory Visit
Admission: RE | Admit: 2022-06-19 | Discharge: 2022-06-19 | Disposition: A | Discharge status: Home / Self Care | Payer: Medicare Other | Source: Ambulatory Visit | Attending: Family Medicine | Admitting: Family Medicine

## 2022-06-19 DIAGNOSIS — F17210 Nicotine dependence, cigarettes, uncomplicated: Secondary | ICD-10-CM | POA: Diagnosis not present

## 2022-06-19 DIAGNOSIS — Z122 Encounter for screening for malignant neoplasm of respiratory organs: Secondary | ICD-10-CM

## 2022-07-17 DIAGNOSIS — R634 Abnormal weight loss: Secondary | ICD-10-CM | POA: Diagnosis not present

## 2022-07-17 DIAGNOSIS — Z6822 Body mass index (BMI) 22.0-22.9, adult: Secondary | ICD-10-CM | POA: Diagnosis not present

## 2022-07-17 DIAGNOSIS — I2510 Atherosclerotic heart disease of native coronary artery without angina pectoris: Secondary | ICD-10-CM | POA: Diagnosis not present

## 2022-07-17 DIAGNOSIS — R29898 Other symptoms and signs involving the musculoskeletal system: Secondary | ICD-10-CM | POA: Diagnosis not present

## 2022-10-02 ENCOUNTER — Ambulatory Visit: Payer: Self-pay | Admitting: Cardiology

## 2022-10-05 DIAGNOSIS — Z961 Presence of intraocular lens: Secondary | ICD-10-CM | POA: Diagnosis not present

## 2022-10-05 DIAGNOSIS — H538 Other visual disturbances: Secondary | ICD-10-CM | POA: Diagnosis not present

## 2022-10-16 DIAGNOSIS — N200 Calculus of kidney: Secondary | ICD-10-CM | POA: Diagnosis not present

## 2022-10-16 DIAGNOSIS — R3912 Poor urinary stream: Secondary | ICD-10-CM | POA: Diagnosis not present

## 2022-10-30 ENCOUNTER — Ambulatory Visit: Payer: Medicare Other | Admitting: Cardiology

## 2022-12-18 ENCOUNTER — Ambulatory Visit: Payer: Medicare Other | Attending: Cardiology | Admitting: Cardiology

## 2022-12-18 ENCOUNTER — Encounter: Payer: Self-pay | Admitting: Cardiology

## 2022-12-18 VITALS — BP 144/77 | HR 67 | Resp 16 | Ht 69.0 in | Wt 143.8 lb

## 2022-12-18 DIAGNOSIS — E782 Mixed hyperlipidemia: Secondary | ICD-10-CM | POA: Diagnosis not present

## 2022-12-18 DIAGNOSIS — I10 Essential (primary) hypertension: Secondary | ICD-10-CM

## 2022-12-18 DIAGNOSIS — R0989 Other specified symptoms and signs involving the circulatory and respiratory systems: Secondary | ICD-10-CM | POA: Diagnosis not present

## 2022-12-18 DIAGNOSIS — I2510 Atherosclerotic heart disease of native coronary artery without angina pectoris: Secondary | ICD-10-CM | POA: Diagnosis not present

## 2022-12-18 NOTE — Progress Notes (Signed)
Cardiology Office Note:  .   Date:  12/18/2022  ID:  Mark Espinoza, DOB 11/21/1945, MRN [id number] PCP: Mark Dimitri, MD  [facility] Providers Cardiologist:  Truett Mainland, MD PCP: Mark Dimitri, MD  Chief Complaint  Patient presents with   Coronary Artery Disease   New Patient (Initial Visit)      History of Present Illness: .    Mark Espinoza is a 77 y.o. male with hypertension, hyperlipidemia, COPD, multivessel coronary atherosclerosis noted on CT chest  Patient is a retired Immunologist. He stays active with house chores but no regular exercise. He was previously on Aspirin, but was stopped due to upper GI bleeding. He reportedly underwent endoscopy with [facility] Dr. [name], reports not available to me. Blood pressure and cholesterol are well controlled. He denies chest pain, shortness of breath, palpitations, leg edema, orthopnea, PND, TIA/syncope. He smokes 1 PPD for 50 years, not ready to quit at this time.  Vitals:   12/18/22 1008  BP: (!) 144/77  Pulse: 67  Resp: 16  SpO2: 97%     ROS:  Review of Systems  Cardiovascular:  Negative for chest pain, dyspnea on exertion, leg swelling, palpitations and syncope.     Studies Reviewed: Mark Espinoza Kitchen        EKG 12/18/2022: Normal sinus rhythm Normal ECG When compared with ECG of 02-Apr-2002 12:34, No significant change was found    Independently interpreted 04/2022: HbA1C 6.4% Hb 13.3  102023: Chol 129, TG 100, HDL 66, LDL 44   CT chest 05/6211: 1. Lung-RADS 2S, benign appearance or behavior. Continue annual screening with low-dose chest CT without contrast in 12 months. 2. The "S" modifier above refers to potentially clinically significant non lung cancer related findings. Specifically, there is aortic atherosclerosis, in addition to left main and three-vessel coronary artery disease. Please note that although the presence of coronary artery calcium documents the presence of coronary  artery disease, the severity of this disease and any potential stenosis cannot be assessed on this non-gated CT examination. Assessment for potential risk factor modification, dietary therapy or pharmacologic therapy may be warranted, if clinically indicated. 3. Mild diffuse bronchial wall thickening with moderate centrilobular and paraseptal emphysema; imaging findings suggestive of underlying COPD. 4. There are mild calcifications of the aortic valve. Echocardiographic correlation for evaluation of potential valvular dysfunction may be warranted if clinically indicated.   Aortic Atherosclerosis (ICD10-I70.0) and Emphysema (ICD10-J43.9).      Physical Exam:   Physical Exam Vitals and nursing note reviewed.  Constitutional:      General: He is not in acute distress. Neck:     Vascular: No JVD.  Cardiovascular:     Rate and Rhythm: Normal rate and regular rhythm.     Pulses:          Dorsalis pedis pulses are 1+ on the right side and 0 on the left side.       Posterior tibial pulses are 2+ on the right side and 1+ on the left side.     Heart sounds: Normal heart sounds. No murmur heard. Pulmonary:     Effort: Pulmonary effort is normal.     Breath sounds: Normal breath sounds. No wheezing or rales.  Musculoskeletal:     Right lower leg: No edema.     Left lower leg: No edema.      VISIT DIAGNOSES:   ICD-10-CM   1. Coronary artery disease involving native coronary artery of native heart without angina pectoris  I25.10 EKG 12-Lead    MYOCARDIAL PERFUSION IMAGING    Cardiac Stress Test: Informed Consent Details: Physician/Practitioner Attestation; Transcribe to consent form and obtain patient signature    2. Abnormal pulse  R09.89 VAS [place] ABI WITH/WO TBI    VAS [place] LOWER EXTREMITY ARTERIAL DUPLEX    3. Mixed hyperlipidemia  E78.2     4. Primary hypertension  I10        ASSESSMENT AND PLAN: .    Mark Espinoza is a 77 y.o. male with hypertension, hyperlipidemia,  COPD, multivessel coronary atherosclerosis noted on CT chest  Coronary atherosclerosis: Left main and three-vessel coronary atherosclerosis noted on CT chest 06/2022. No symptoms of angina. Recommend exercise nuclear stress test for risk stratification. He would like to avoid Aspirin due to prior h/o upper GI bleeding.   Abnormal pulse: Suspect PAD, asmptomatic. Will check ABI.  Hypertension: Generally well controlled.  Mixed hyperlipidemia: Controlled, with even low dose simvastatin.   COPD: Management as per PCP.   Informed Consent   Shared Decision Making/Informed Consent The risks [chest pain, shortness of breath, cardiac arrhythmias, dizziness, blood pressure fluctuations, myocardial infarction, stroke/transient ischemic attack, nausea, vomiting, allergic reaction, radiation exposure, metallic taste sensation and life-threatening complications (estimated to be 1 in 10,000)], benefits (risk stratification, diagnosing coronary artery disease, treatment guidance) and alternatives of a nuclear stress test were discussed in detail with Mark Espinoza and he agrees to proceed.         F/u in 1 year  Signed, Elder Negus, MD

## 2022-12-18 NOTE — Patient Instructions (Addendum)
Medication Instructions:  Your physician recommends that you continue on your current medications as directed. Please refer to the Current Medication list given to you today.  *If you need a refill on your cardiac medications before your next appointment, please call your pharmacy*  Lab Work: None ordered today. If you have labs (blood work) drawn today and your tests are completely normal, you will receive your results only by: MyChart Message (if you have MyChart) OR A paper copy in the mail If you have any lab test that is abnormal or we need to change your treatment, we will call you to review the results.  Testing/Procedures: Your physician has requested that you have an exercise stress myoview. For further information please visit [email or web address] Please follow instruction sheet, as given.   Your physician has requested that you have an ankle brachial index (ABI). During this test an ultrasound and blood pressure cuff are used to evaluate the arteries that supply the arms and legs with blood. Allow thirty minutes for this exam. There are no restrictions or special instructions.  Please note: We ask at that you not bring children with you during ultrasound (echo/ vascular) testing. Due to room size and safety concerns, children are not allowed in the ultrasound rooms during exams. Our front office staff cannot provide observation of children in our lobby area while testing is being conducted. An adult accompanying a patient to their appointment will only be allowed in the ultrasound room at the discretion of the ultrasound technician under special circumstances. We apologize for any inconvenience.   Your physician has requested that you have a lower extremity arterial duplex. This test is an ultrasound of the arteries in the legs or arms. It looks at arterial blood flow in the legs and arms. Allow one hour for Lower and Upper Arterial scans. There are no restrictions or special  instructions.  Please note: We ask at that you not bring children with you during ultrasound (echo/ vascular) testing. Due to room size and safety concerns, children are not allowed in the ultrasound rooms during exams. Our front office staff cannot provide observation of children in our lobby area while testing is being conducted. An adult accompanying a patient to their appointment will only be allowed in the ultrasound room at the discretion of the ultrasound technician under special circumstances. We apologize for any inconvenience.   Follow-Up: At [facility], you and your health needs are our priority.  As part of our continuing mission to provide you with exceptional heart care, we have created designated Provider Care Teams.  These Care Teams include your primary Cardiologist (physician) and Advanced Practice Providers (APPs -  Physician Assistants and Nurse Practitioners) who all work together to provide you with the care you need, when you need it.  Your next appointment:   1 year(s)  The format for your next appointment:   In Person  Provider:   Elder Negus, MD {  Other Instructions Exercise Myoview (Stress Test) Instructions  Please arrive 15 minutes prior to your appointment time for registration and insurance purposes.   The test will take approximately 3 to 4 hours to complete; you may bring reading material.  If someone comes with you to your appointment, they will need to remain in the main lobby due to limited space in the testing area. **If you are pregnant or breastfeeding, please notify the nuclear lab prior to your appointment**   How to prepare for your Myocardial Perfusion Test: Do not  eat or drink 3 hours prior to your test, except you may have water. Do not consume products containing caffeine (regular or decaffeinated) 12 hours prior to your test. (ex: coffee, chocolate, sodas, tea). Do bring a list of your current medications with you.  If not listed  below, you may take your medications as normal. Do wear comfortable clothes (no dresses or overalls) and walking shoes, tennis shoes preferred (No heels or open toe shoes are allowed). Do NOT wear cologne, perfume, aftershave, or lotions (deodorant is allowed). If these instructions are not followed, your test will have to be rescheduled.   Please report to [address] for your test.  If you have questions or concerns about your appointment, you can call the Nuclear Lab at [phone].   If you cannot keep your appointment, please provide 24 hours notification to the Nuclear Lab, to avoid a possible $50 charge to your account.

## 2022-12-19 DIAGNOSIS — E782 Mixed hyperlipidemia: Secondary | ICD-10-CM | POA: Diagnosis not present

## 2022-12-19 DIAGNOSIS — E1122 Type 2 diabetes mellitus with diabetic chronic kidney disease: Secondary | ICD-10-CM | POA: Diagnosis not present

## 2022-12-19 DIAGNOSIS — R809 Proteinuria, unspecified: Secondary | ICD-10-CM | POA: Diagnosis not present

## 2022-12-20 DIAGNOSIS — I2510 Atherosclerotic heart disease of native coronary artery without angina pectoris: Secondary | ICD-10-CM | POA: Diagnosis not present

## 2022-12-20 DIAGNOSIS — Z23 Encounter for immunization: Secondary | ICD-10-CM | POA: Diagnosis not present

## 2022-12-20 DIAGNOSIS — G479 Sleep disorder, unspecified: Secondary | ICD-10-CM | POA: Diagnosis not present

## 2022-12-20 DIAGNOSIS — L219 Seborrheic dermatitis, unspecified: Secondary | ICD-10-CM | POA: Diagnosis not present

## 2022-12-20 DIAGNOSIS — R0989 Other specified symptoms and signs involving the circulatory and respiratory systems: Secondary | ICD-10-CM | POA: Diagnosis not present

## 2022-12-20 DIAGNOSIS — F17210 Nicotine dependence, cigarettes, uncomplicated: Secondary | ICD-10-CM | POA: Diagnosis not present

## 2022-12-20 DIAGNOSIS — I10 Essential (primary) hypertension: Secondary | ICD-10-CM | POA: Diagnosis not present

## 2022-12-20 DIAGNOSIS — E1122 Type 2 diabetes mellitus with diabetic chronic kidney disease: Secondary | ICD-10-CM | POA: Diagnosis not present

## 2023-01-24 ENCOUNTER — Telehealth (HOSPITAL_COMMUNITY): Payer: Self-pay | Admitting: *Deleted

## 2023-01-24 NOTE — Telephone Encounter (Signed)
 Patient given detailed instructions per Myocardial Perfusion Study Information Sheet for the test on 01/29/2023 at 10:00. Patient notified to arrive 15 minutes early and that it is imperative to arrive on time for appointment to keep from having the test rescheduled.  If you need to cancel or reschedule your appointment, please call the office within 24 hours of your appointment. . Patient verbalized understanding.Anne Barrios

## 2023-01-29 ENCOUNTER — Ambulatory Visit (HOSPITAL_COMMUNITY): Payer: Medicare Other

## 2023-01-31 ENCOUNTER — Encounter (HOSPITAL_COMMUNITY): Payer: Self-pay

## 2023-02-05 ENCOUNTER — Ambulatory Visit (HOSPITAL_COMMUNITY)
Admission: RE | Admit: 2023-02-05 | Discharge: 2023-02-05 | Disposition: A | Discharge status: Home / Self Care | Payer: Medicare Other | Source: Ambulatory Visit | Attending: Cardiology | Admitting: Cardiology

## 2023-02-05 ENCOUNTER — Ambulatory Visit (HOSPITAL_BASED_OUTPATIENT_CLINIC_OR_DEPARTMENT_OTHER)
Admission: RE | Admit: 2023-02-05 | Discharge: 2023-02-05 | Disposition: A | Discharge status: Home / Self Care | Payer: Medicare Other | Source: Ambulatory Visit | Attending: Cardiology | Admitting: Cardiology

## 2023-02-05 DIAGNOSIS — R0989 Other specified symptoms and signs involving the circulatory and respiratory systems: Secondary | ICD-10-CM

## 2023-02-05 NOTE — Progress Notes (Signed)
Mild peripheral artery disease seen in both lower extremities.  In absence of symptoms of leg pain or signs of severely reduced blood flow to 2 extremities, reasonable to continue aggressive risk factor modification, medical management, and regular walking. Continue statin.  I am aware that patient is hesitant to start aspirin given history of GI bleeding in the past.  Please ask him to discuss with his gastroenterologist Dr. Ewing Schlein.  If agreeable, we could use Plavix which has relatively less bad effect on stomach lining.  Thanks MJP

## 2023-02-06 ENCOUNTER — Telehealth: Payer: Self-pay | Admitting: *Deleted

## 2023-02-06 LAB — VAS US ABI WITH/WO TBI
Left ABI: 0.93
Right ABI: 0.88

## 2023-02-06 MED ORDER — CLOPIDOGREL BISULFATE 75 MG PO TABS
75.0000 mg | ORAL_TABLET | Freq: Every day | ORAL | 2 refills | Status: DC
Start: 2023-02-06 — End: 2023-09-12

## 2023-02-06 NOTE — Progress Notes (Signed)
Sounds good.  Thanks MJP

## 2023-02-06 NOTE — Telephone Encounter (Signed)
-----   Message from [facility] sent at 02/05/2023  3:58 PM EST ----- Mild peripheral artery disease seen in both lower extremities.  In absence of symptoms of leg pain or signs of severely reduced blood flow to 2 extremities, reasonable to continue aggressive risk factor modification, medical management, and regular walking. Continue statin.  I am aware that patient is hesitant to start aspirin given history of GI bleeding in the past.  Please ask him to discuss with his gastroenterologist Dr. Ewing Schlein.  If agreeable, we could use Plavix which has relatively less bad effect on stomach lining.  Thanks MJP

## 2023-02-06 NOTE — Telephone Encounter (Signed)
The patient has been notified of the result and verbalized understanding.  All questions (if any) were answered.  Pt states he would like for the plavix script to go ahead and be sent to [facility] for fill, but he will not start taking this until he speaks with both his PCP and Gastroenterologist Dr. Ewing Schlein.   Pt is aware that I will send this result to both his PCP and Dr. Ewing Schlein.   Will also route this result to Dr. Rosemary Holms to make him aware of this plan as well.   Pt verbalized understanding and agrees with this plan.

## 2023-02-09 ENCOUNTER — Encounter (HOSPITAL_COMMUNITY): Payer: Medicare Other

## 2023-03-06 ENCOUNTER — Ambulatory Visit (HOSPITAL_COMMUNITY): Payer: Medicare Other

## 2023-03-26 DIAGNOSIS — E782 Mixed hyperlipidemia: Secondary | ICD-10-CM | POA: Diagnosis not present

## 2023-03-26 DIAGNOSIS — R801 Persistent proteinuria, unspecified: Secondary | ICD-10-CM | POA: Diagnosis not present

## 2023-03-27 ENCOUNTER — Encounter (HOSPITAL_COMMUNITY): Payer: Self-pay

## 2023-04-04 ENCOUNTER — Telehealth (HOSPITAL_COMMUNITY): Payer: Self-pay | Admitting: *Deleted

## 2023-04-04 NOTE — Telephone Encounter (Signed)
 Spoke to patient and he was given detailed instructions about his STRESS TEST on 04/06/23 at 10:00.

## 2023-04-06 ENCOUNTER — Ambulatory Visit (HOSPITAL_COMMUNITY): Payer: Medicare Other | Attending: Cardiology

## 2023-04-06 DIAGNOSIS — I2510 Atherosclerotic heart disease of native coronary artery without angina pectoris: Secondary | ICD-10-CM

## 2023-04-06 LAB — MYOCARDIAL PERFUSION IMAGING
Angina Index: 0
Duke Treadmill Score: 4
Estimated workload: 5.1
Exercise duration (min): 4 min
Exercise duration (sec): 1 s
LV dias vol: 70 mL (ref 62–150)
LV sys vol: 21 mL
MPHR: 142 {beats}/min
Nuc Stress EF: 69 %
Peak HR: 141 {beats}/min
Percent HR: 99 %
Rest HR: 73 {beats}/min
Rest Nuclear Isotope Dose: 10.2 mCi
SDS: 1
SRS: 0
SSS: 1
ST Depression (mm): 0 mm
Stress Nuclear Isotope Dose: 30.9 mCi
TID: 0.88

## 2023-04-06 MED ORDER — TECHNETIUM TC 99M TETROFOSMIN IV KIT
30.9000 | PACK | Freq: Once | INTRAVENOUS | Status: AC | PRN
  Administered 2023-04-06: 30.9 via INTRAVENOUS

## 2023-04-06 MED ORDER — TECHNETIUM TC 99M TETROFOSMIN IV KIT
10.2000 | PACK | Freq: Once | INTRAVENOUS | Status: AC | PRN
  Administered 2023-04-06: 10.2 via INTRAVENOUS

## 2023-04-10 ENCOUNTER — Encounter: Payer: Self-pay | Admitting: Cardiology

## 2023-07-11 DIAGNOSIS — F17210 Nicotine dependence, cigarettes, uncomplicated: Secondary | ICD-10-CM | POA: Diagnosis not present

## 2023-07-11 DIAGNOSIS — R801 Persistent proteinuria, unspecified: Secondary | ICD-10-CM | POA: Diagnosis not present

## 2023-07-11 DIAGNOSIS — E782 Mixed hyperlipidemia: Secondary | ICD-10-CM | POA: Diagnosis not present

## 2023-07-11 DIAGNOSIS — G479 Sleep disorder, unspecified: Secondary | ICD-10-CM | POA: Diagnosis not present

## 2023-07-11 DIAGNOSIS — I2510 Atherosclerotic heart disease of native coronary artery without angina pectoris: Secondary | ICD-10-CM | POA: Diagnosis not present

## 2023-07-11 DIAGNOSIS — E1122 Type 2 diabetes mellitus with diabetic chronic kidney disease: Secondary | ICD-10-CM | POA: Diagnosis not present

## 2023-07-11 DIAGNOSIS — L219 Seborrheic dermatitis, unspecified: Secondary | ICD-10-CM | POA: Diagnosis not present

## 2023-07-11 DIAGNOSIS — G629 Polyneuropathy, unspecified: Secondary | ICD-10-CM | POA: Diagnosis not present

## 2023-07-11 DIAGNOSIS — I10 Essential (primary) hypertension: Secondary | ICD-10-CM | POA: Diagnosis not present

## 2023-07-11 DIAGNOSIS — I739 Peripheral vascular disease, unspecified: Secondary | ICD-10-CM | POA: Diagnosis not present

## 2023-07-11 DIAGNOSIS — J449 Chronic obstructive pulmonary disease, unspecified: Secondary | ICD-10-CM | POA: Diagnosis not present

## 2023-08-08 DIAGNOSIS — R262 Difficulty in walking, not elsewhere classified: Secondary | ICD-10-CM | POA: Diagnosis not present

## 2023-08-10 DIAGNOSIS — R220 Localized swelling, mass and lump, head: Secondary | ICD-10-CM | POA: Diagnosis not present

## 2023-08-15 DIAGNOSIS — R262 Difficulty in walking, not elsewhere classified: Secondary | ICD-10-CM | POA: Diagnosis not present

## 2023-08-23 DIAGNOSIS — R262 Difficulty in walking, not elsewhere classified: Secondary | ICD-10-CM | POA: Diagnosis not present

## 2023-08-29 DIAGNOSIS — R262 Difficulty in walking, not elsewhere classified: Secondary | ICD-10-CM | POA: Diagnosis not present

## 2023-09-05 DIAGNOSIS — R262 Difficulty in walking, not elsewhere classified: Secondary | ICD-10-CM | POA: Diagnosis not present

## 2023-09-11 ENCOUNTER — Other Ambulatory Visit: Payer: Self-pay | Admitting: Cardiology

## 2023-10-11 DIAGNOSIS — Z961 Presence of intraocular lens: Secondary | ICD-10-CM | POA: Diagnosis not present

## 2023-10-11 DIAGNOSIS — H04123 Dry eye syndrome of bilateral lacrimal glands: Secondary | ICD-10-CM | POA: Diagnosis not present
